# Patient Record
Sex: Male | Born: 1990 | Race: White | Hispanic: No | Smoking: Current every day smoker
Health system: Southern US, Community
[De-identification: ages and names within clinical notes are randomized; demographics above are authoritative.]

## PROBLEM LIST (undated history)

## (undated) DIAGNOSIS — F32A Depression, unspecified: Secondary | ICD-10-CM

## (undated) DIAGNOSIS — T7840XA Allergy, unspecified, initial encounter: Secondary | ICD-10-CM

## (undated) HISTORY — DX: Depression, unspecified: F32.A

## (undated) HISTORY — PX: WISDOM TOOTH EXTRACTION: SHX21

## (undated) HISTORY — DX: Allergy, unspecified, initial encounter: T78.40XA

---

## 2014-04-20 ENCOUNTER — Encounter: Payer: Self-pay | Admitting: Podiatry

## 2014-04-20 ENCOUNTER — Ambulatory Visit (INDEPENDENT_AMBULATORY_CARE_PROVIDER_SITE_OTHER): Payer: 59 | Admitting: Podiatry

## 2014-04-20 VITALS — BP 109/73 | HR 74 | Ht 70.0 in | Wt 200.0 lb

## 2014-04-20 DIAGNOSIS — L600 Ingrowing nail: Secondary | ICD-10-CM | POA: Diagnosis not present

## 2014-04-20 NOTE — Progress Notes (Signed)
   Subjective:    Patient ID: Nathan Johnson, male    DOB: 10/30/1990, 24 y.o.   MRN: [id number]  HPI Comments: N ingrown toenails L B/L 1st medial toenail borders D 1 month O C pain and redness A enclosed shoes T hx of B/L 1st lateral toenail borders  Patient has history of matricectomy's to the lateral margin of the hallux nails approximated 12 years ago   Review of Systems  All other systems reviewed and are negative.      Objective:   Physical Exam  Orientated 3  Vascular: DP pulses 2/4 bilaterally PT pulses 2/4 bilaterally Capillary reflex media bilaterally  Neurological: Sensation to 10 g monofilament wire intact 5/5 bilaterally Ankle reflex equal and reactive bilaterally  Dermatological: Texture and turgor within normal limits bilaterally The medial margins of the right and left hallux toenails are incurvated with callused nail grooves and tenderness to palpation The lateral margin of the hallux nails were narrowed previously from surgical treatment  Musculoskeletal: No deformities noted bilaterally There is no restriction ankle, subtalar, midtarsal joints bilaterally       Assessment & Plan:   Assessment: Satisfactory neurovascular status Ingrowing medial margins of the right and left hallux toenails  Plan: Discuss treatment options including no treatment, repetitive debridement, permanent nail removal. Patient verbally consents to permanent nail removal of the medial margins of the hallux toenails  Plan: The right and left hallux were blocked with 3 mL 50-50 mixture of 2% plain Xylocaine and 0.5% plain Marcaine. The toes were pain with Betadine and exsanguinated. The medial margin of the right and left hallux toenail were excised and a phenol matricectomy performed. An antibiotic compression dressing was applied. The tourniquets were released and spontaneous capillary filling times noted to the right and left hallux. Patient tolerated procedures  without any difficulty  Postoperative oral reconstruction provided  Reappoint at patient's request

## 2014-04-20 NOTE — Patient Instructions (Signed)
Okay to use over-the-counter Tylenol, ibuprofen, naproxen, as needed for pain control Leave initial bandages on today and remove tomorrow your convenience  ANTIBACTERIAL SOAP INSTRUCTIONS  THE DAY AFTER PROCEDURE  Please follow the instructions your doctor has marked.   Shower as usual. Before getting out, place a drop of antibacterial liquid soap (Dial) on a wet, clean washcloth.  Gently wipe washcloth over affected area.  Afterward, rinse the area with warm water.  Blot the area dry with a soft cloth and cover with antibiotic ointment (neosporin, polysporin, bacitracin) and band aid or gauze and tape  Place 3-4 drops of antibacterial liquid soap in a quart of warm tap water.  Submerge foot into water for 20 minutes.  If bandage was applied after your procedure, leave on to allow for easy lift off, then remove and continue with soak for the remaining time.  Next, blot area dry with a soft cloth and cover with a bandage.  Apply other medications as directed by your doctor, such as cortisporin otic solution (eardrops) or neosporin antibiotic ointment

## 2014-05-22 ENCOUNTER — Other Ambulatory Visit: Payer: Self-pay

## 2014-05-22 ENCOUNTER — Emergency Department (HOSPITAL_BASED_OUTPATIENT_CLINIC_OR_DEPARTMENT_OTHER)
Admission: EM | Admit: 2014-05-22 | Discharge: 2014-05-22 | Disposition: A | Discharge status: Home / Self Care | Payer: Managed Care, Other (non HMO) | Attending: Emergency Medicine | Admitting: Emergency Medicine

## 2014-05-22 ENCOUNTER — Encounter (HOSPITAL_BASED_OUTPATIENT_CLINIC_OR_DEPARTMENT_OTHER): Payer: Self-pay | Admitting: *Deleted

## 2014-05-22 DIAGNOSIS — R0602 Shortness of breath: Secondary | ICD-10-CM | POA: Diagnosis not present

## 2014-05-22 DIAGNOSIS — F419 Anxiety disorder, unspecified: Secondary | ICD-10-CM | POA: Diagnosis present

## 2014-05-22 DIAGNOSIS — Z79899 Other long term (current) drug therapy: Secondary | ICD-10-CM | POA: Diagnosis not present

## 2014-05-22 DIAGNOSIS — R202 Paresthesia of skin: Secondary | ICD-10-CM | POA: Diagnosis not present

## 2014-05-22 DIAGNOSIS — Z87891 Personal history of nicotine dependence: Secondary | ICD-10-CM | POA: Insufficient documentation

## 2014-05-22 DIAGNOSIS — R000 Tachycardia, unspecified: Secondary | ICD-10-CM | POA: Diagnosis not present

## 2014-05-22 DIAGNOSIS — F410 Panic disorder [episodic paroxysmal anxiety] without agoraphobia: Secondary | ICD-10-CM | POA: Diagnosis not present

## 2014-05-22 DIAGNOSIS — G479 Sleep disorder, unspecified: Secondary | ICD-10-CM | POA: Diagnosis not present

## 2014-05-22 MED ORDER — LORAZEPAM 1 MG PO TABS
ORAL_TABLET | ORAL | Status: AC
  Filled 2014-05-22: qty 2

## 2014-05-22 MED ORDER — ALPRAZOLAM 0.5 MG PO TABS: 0.5000 mg | ORAL_TABLET | Freq: Three times a day (TID) | ORAL | Status: DC | PRN

## 2014-05-22 MED ORDER — LORAZEPAM 1 MG PO TABS: 2.0000 mg | ORAL_TABLET | Freq: Once | ORAL | Status: DC

## 2014-05-22 MED ORDER — LORAZEPAM 1 MG PO TABS
2.0000 mg | ORAL_TABLET | Freq: Once | ORAL | Status: AC
  Administered 2014-05-22: 2 mg via ORAL

## 2014-05-22 NOTE — ED Notes (Signed)
Pt very anxious. Pt hyperventilating and c/o tightness in his hands and arms. Pt sts sudden onset while working apprx. 45 min PTA. Pt denies prior anxiety attacks but admits to being under a lot of stress.

## 2014-05-22 NOTE — Discharge Instructions (Signed)
Take xanax as directed for severe panic only. No driving or operating heavy machinery while taking xanax as it may cause drowsiness. Follow-up with your primary care physician on Monday as scheduled to discuss this panic attack.  Panic Attacks Panic attacks are sudden, short-livedsurges of severe anxiety, fear, or discomfort. They may occur for no reason when you are relaxed, when you are anxious, or when you are sleeping. Panic attacks may occur for a number of reasons:   Healthy people occasionally have panic attacks in extreme, life-threatening situations, such as war or natural disasters. Normal anxiety is a protective mechanism of the body that helps [place] react to danger (fight or flight response).  Panic attacks are often seen with anxiety disorders, such as panic disorder, social anxiety disorder, generalized anxiety disorder, and phobias. Anxiety disorders cause excessive or uncontrollable anxiety. They may interfere with your relationships or other life activities.  Panic attacks are sometimes seen with other mental illnesses, such as depression and posttraumatic stress disorder.  Certain medical conditions, prescription medicines, and drugs of abuse can cause panic attacks. SYMPTOMS  Panic attacks start suddenly, peak within 20 minutes, and are accompanied by four or more of the following symptoms:  Pounding heart or fast heart rate (palpitations).  Sweating.  Trembling or shaking.  Shortness of breath or feeling smothered.  Feeling choked.  Chest pain or discomfort.  Nausea or strange feeling in your stomach.  Dizziness, light-headedness, or feeling like you will faint.  Chills or hot flushes.  Numbness or tingling in your lips or hands and feet.  Feeling that things are not real or feeling that you are not yourself.  Fear of losing control or going crazy.  Fear of dying. Some of these symptoms can mimic serious medical conditions. For example, you may think you are  having a heart attack. Although panic attacks can be very scary, they are not life threatening. DIAGNOSIS  Panic attacks are diagnosed through an assessment by your health care provider. Your health care provider will ask questions about your symptoms, such as where and when they occurred. Your health care provider will also ask about your medical history and use of alcohol and drugs, including prescription medicines. Your health care provider may order blood tests or other studies to rule out a serious medical condition. Your health care provider may refer you to a mental health professional for further evaluation. TREATMENT   Most healthy people who have one or two panic attacks in an extreme, life-threatening situation will not require treatment.  The treatment for panic attacks associated with anxiety disorders or other mental illness typically involves counseling with a mental health professional, medicine, or a combination of both. Your health care provider will help determine what treatment is best for you.  Panic attacks due to physical illness usually go away with treatment of the illness. If prescription medicine is causing panic attacks, talk with your health care provider about stopping the medicine, decreasing the dose, or substituting another medicine.  Panic attacks due to alcohol or drug abuse go away with abstinence. Some adults need professional help in order to stop drinking or using drugs. HOME CARE INSTRUCTIONS   Take all medicines as directed by your health care provider.   Schedule and attend follow-up visits as directed by your health care provider. It is important to keep all your appointments. SEEK MEDICAL CARE IF:  You are not able to take your medicines as prescribed.  Your symptoms do not improve or get worse.  SEEK IMMEDIATE MEDICAL CARE IF:   You experience panic attack symptoms that are different than your usual symptoms.  You have serious thoughts about  hurting yourself or others.  You are taking medicine for panic attacks and have a serious side effect. MAKE SURE YOU:  Understand these instructions.  Will watch your condition.  Will get help right away if you are not doing well or get worse. Document Released: 01/30/2005 Document Revised: 02/04/2013 Document Reviewed: 09/13/2012 [facility] Patient Information 2015 [place], [place]. This information is not intended to replace advice given to you by your health care provider. Make sure you discuss any questions you have with your health care provider.

## 2014-05-22 NOTE — ED Provider Notes (Signed)
CSN: [id number]     Arrival date & time 05/22/14  1530 History   First MD Initiated Contact with Patient 05/22/14 1539     Chief Complaint  Patient presents with  . Anxiety     (Consider location/radiation/quality/duration/timing/severity/associated sxs/prior Treatment) HPI Comments: 24 y/o M c/o increased anxiety x 45 minutes. States he was at work when he started to feel increased stress, fell to his knees, experienced chest tightness, shortness of breath, racing heart, peri-oral tingling, bilateral hand stiffness, and tingling in his arms. Symptoms have slightly eased off since they began, however when his mind starts racing they return. Admits to being under increased stress at this time in his marriage and at work. States he "messed up" and recently told his wife which is increasing his stress. Denies hx of panic attacks, however reports has been diagnosed with bipolar in the past, was on medication, however did not like the way it felt and he came off. Denies SI/HI. Former smoker.  Patient is a 24 y.o. male presenting with anxiety. The history is provided by the patient.  Anxiety    History reviewed. No pertinent past medical history. Past Surgical History  Procedure Laterality Date  . Wisdom tooth extraction     No family history on file. History  Substance Use Topics  . Smoking status: Former Games developermoker  . Smokeless tobacco: Not on file  . Alcohol Use: 0.0 oz/week    0 Standard drinks or equivalent per week    Review of Systems  Respiratory: Positive for chest tightness and shortness of breath.   Neurological:       +Tingling.  Psychiatric/Behavioral: Positive for sleep disturbance and decreased concentration. The patient is nervous/anxious.   All other systems reviewed and are negative.     Allergies  Sulfa antibiotics  Home Medications   Prior to Admission medications   Medication Sig Start Date End Date Taking? Authorizing Provider  Multiple Vitamin  (MULTIVITAMIN) tablet Take 1 tablet by mouth daily.   Yes Historical Provider, MD  ALPRAZolam Prudy Feeler(XANAX) 0.5 MG tablet Take 1 tablet (0.5 mg total) by mouth every 8 (eight) hours as needed for anxiety. 05/22/14   Kathrynn Speedobyn M Sarath Privott, PA-C  valACYclovir (VALTREX) 500 MG tablet Take 500 mg by mouth 2 (two) times daily.    Historical Provider, MD   BP 150/82 mmHg  Pulse 92  Temp(Src) 98 F (36.7 C) (Oral)  Resp 20  Ht 5\' 10"  (1.778 m)  Wt 195 lb (88.451 kg)  BMI 27.98 kg/m2  SpO2 100% Physical Exam  Constitutional: He is oriented to person, place, and time. He appears well-developed and well-nourished. No distress.  HENT:  Head: Normocephalic and atraumatic.  Mouth/Throat: Oropharynx is clear and moist.  Eyes: Conjunctivae and EOM are normal. Pupils are equal, round, and reactive to light.  Neck: Normal range of motion. Neck supple. No JVD present.  Cardiovascular: Regular rhythm, normal heart sounds and intact distal pulses.   Tachycardic.  Pulmonary/Chest: Effort normal and breath sounds normal. No respiratory distress.  Abdominal: Soft. Bowel sounds are normal. There is no tenderness.  Musculoskeletal: Normal range of motion. He exhibits no edema.  Neurological: He is alert and oriented to person, place, and time. He has normal strength. No sensory deficit.  Speech fluent, goal oriented. Moves limbs without ataxia. Equal grip strength bilateral.  Skin: Skin is warm and dry. He is not diaphoretic.  Psychiatric: His behavior is normal. His mood appears anxious. He expresses no homicidal and no suicidal ideation.  Tearful.  Nursing note and vitals reviewed.   ED Course  Procedures (including critical care time) Labs Review Labs Reviewed - No data to display  Imaging Review No results found.   EKG Interpretation   Date/Time:  Friday May 22 2014 15:37:54 EDT Ventricular Rate:  118 PR Interval:  134 QRS Duration: 102 QT Interval:  324 QTC Calculation: 454 R Axis:   87 Text  Interpretation:  Sinus tachycardia Otherwise normal ECG Confirmed by  HARRISON  MD, FORREST ([id number]) on 05/22/2014 3:40:51 PM      MDM   Final diagnoses:  Panic attack   Anxious, tearful but in no apparent distress. Afebrile, tachycardic, vitals otherwise stable. EKG showing sinus tachycardia, otherwise normal. Doubt cardiac or PE in nature. Symptoms completely resolved after receiving Ativan. Heart rate decreased to the 80s and low 90s. Patient has scheduled an appointment with his PCP in 3 days. I advised him at that time to discuss this panic attack, and possibly starting anti-anxiety medications on a regular basis for long-term. In the meantime, will give an Rx for ten 0.5 mg Xanax for emergency panic attack only. Stable for discharge. Return precautions given. Patient states understanding of treatment care plan and is agreeable.  Kathrynn Speed, PA-C 05/22/14 1648  Purvis Sheffield, MD 05/22/14 [phone]

## 2017-05-25 ENCOUNTER — Ambulatory Visit (INDEPENDENT_AMBULATORY_CARE_PROVIDER_SITE_OTHER): Payer: Managed Care, Other (non HMO) | Admitting: Podiatry

## 2017-05-25 ENCOUNTER — Encounter: Payer: Self-pay | Admitting: Podiatry

## 2017-05-25 ENCOUNTER — Ambulatory Visit (INDEPENDENT_AMBULATORY_CARE_PROVIDER_SITE_OTHER): Payer: Managed Care, Other (non HMO)

## 2017-05-25 VITALS — BP 118/62 | HR 64 | Resp 16

## 2017-05-25 DIAGNOSIS — S90219A Contusion of unspecified great toe with damage to nail, initial encounter: Secondary | ICD-10-CM

## 2017-05-25 NOTE — Patient Instructions (Signed)

## 2017-05-27 NOTE — Progress Notes (Signed)
Subjective:   Patient ID: Nathan Johnson, male   DOB: 27 y.o.   MRN: [id number]   HPI Patient presents stating he damaged his left hallux nail several months ago and is been getting looser and it at times is bothersome and is very discolored.  Patient does not smoke likes to be active   Review of Systems  All other systems reviewed and are negative.       Objective:  Physical Exam  Constitutional: He appears well-developed and well-nourished.  Cardiovascular: Intact distal pulses.  Pulmonary/Chest: Effort normal.  Musculoskeletal: Normal range of motion.  Neurological: He is alert.  Skin: Skin is warm.  Nursing note and vitals reviewed.   Neurovascular status intact muscle strength is adequate range of motion within normal limits with patient found to have discolored left hallux nail that is dystrophic moderately loose and painful when palpated.  Patient has good digital perfusion well oriented x3     Assessment:  Damaged left hallux nail left with significant discoloration of the bed     Plan:  H&P condition reviewed and at this point recommended nail removal and allow any new nail to regrow explain it may not regrow normally ultimately require permanent procedure.  Patient wants this to be done and today I infiltrated 60 mill grams like Marcaine mixture sterile prep applied to the toe using sterile his mentation I removed the nail all proud flesh I flushed the nail plate and applied sterile dressing.  Patient will be seen back to recheck instructions on soaks

## 2017-12-19 ENCOUNTER — Other Ambulatory Visit: Payer: Self-pay | Admitting: Family Medicine

## 2017-12-19 DIAGNOSIS — R748 Abnormal levels of other serum enzymes: Secondary | ICD-10-CM

## 2017-12-19 LAB — HM HEPATITIS C SCREENING LAB: HM Hepatitis Screen: NEGATIVE

## 2017-12-19 LAB — HEPATITIS B SURFACE ANTIGEN: Hepatitis B Surface Ag: NEGATIVE

## 2017-12-24 ENCOUNTER — Ambulatory Visit
Admission: RE | Admit: 2017-12-24 | Discharge: 2017-12-24 | Disposition: A | Discharge status: Home / Self Care | Payer: Managed Care, Other (non HMO) | Source: Ambulatory Visit | Attending: Family Medicine | Admitting: Family Medicine

## 2017-12-24 DIAGNOSIS — R748 Abnormal levels of other serum enzymes: Secondary | ICD-10-CM

## 2018-12-07 ENCOUNTER — Other Ambulatory Visit: Payer: Self-pay

## 2018-12-07 DIAGNOSIS — Z20822 Contact with and (suspected) exposure to covid-19: Secondary | ICD-10-CM

## 2018-12-08 LAB — NOVEL CORONAVIRUS, NAA: SARS-CoV-2, NAA: NOT DETECTED

## 2020-03-18 DIAGNOSIS — F319 Bipolar disorder, unspecified: Secondary | ICD-10-CM | POA: Diagnosis not present

## 2020-05-07 IMAGING — US US ABDOMEN COMPLETE
1 series · 13 of 25 positions shown · non-contrast
Comparison: None.

CLINICAL DATA: The elevated liver enzymes

EXAM:
ABDOMEN ULTRASOUND COMPLETE

[Series 1: us abdomen complete · 0.30mm/px · 13 of 94 slices shown]
[im 1/94]
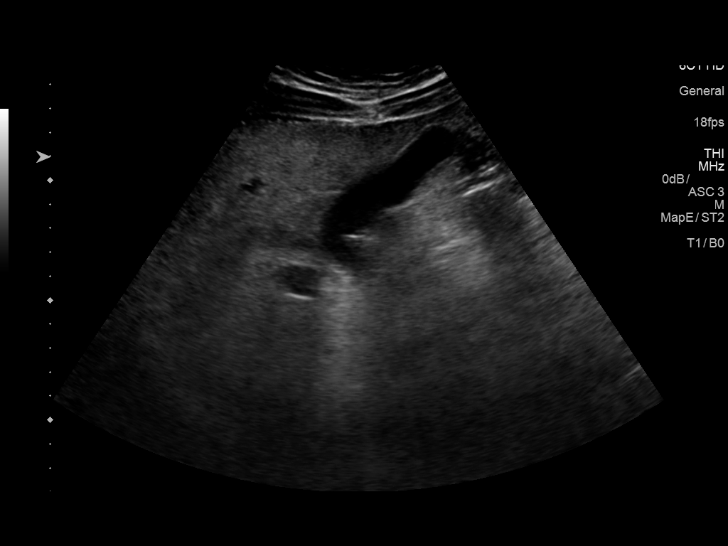
[im 8/94]
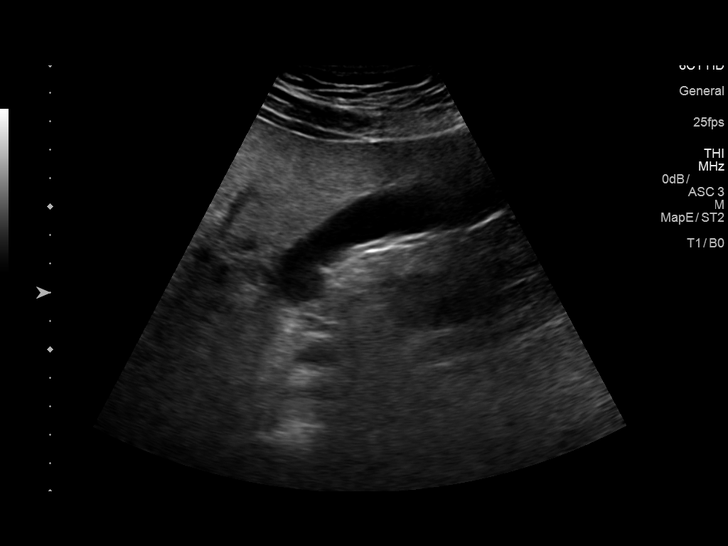
[im 16/94]
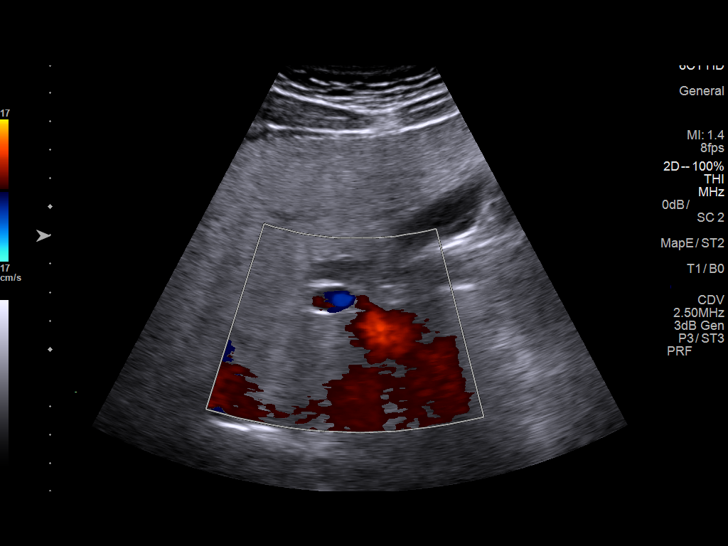
[im 24/94]
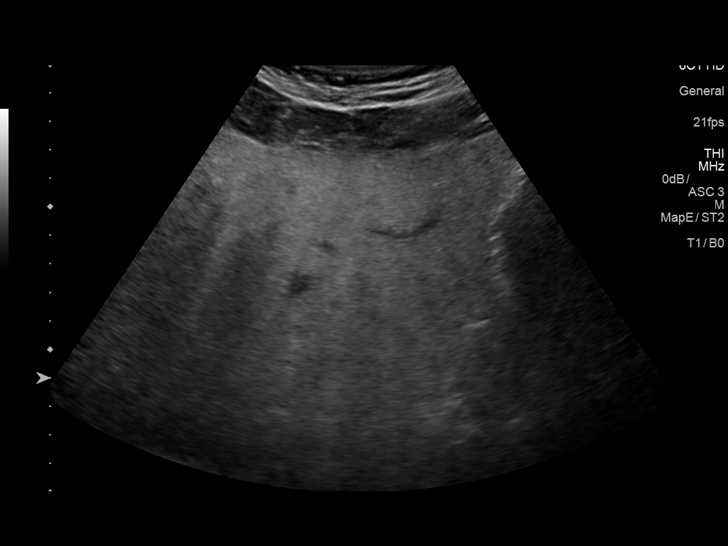
[im 32/94]
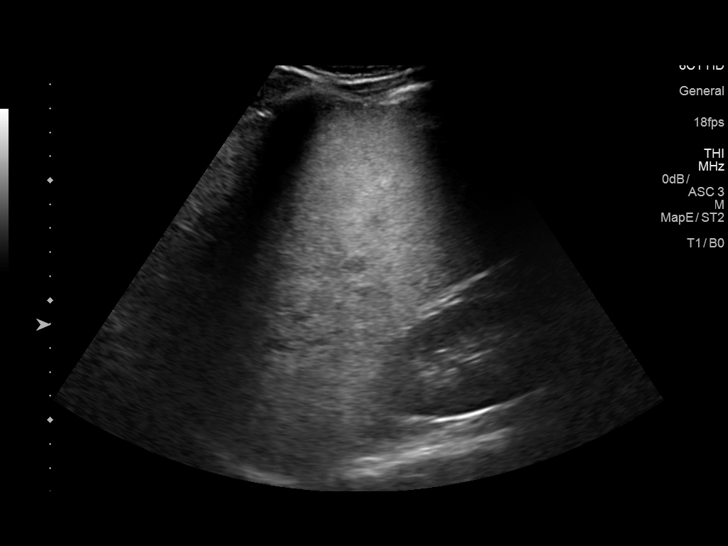
[im 39/94]
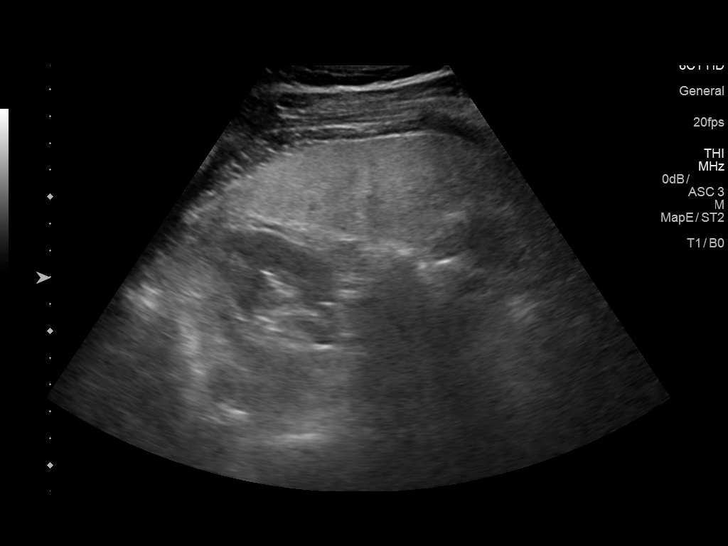
[im 47/94]
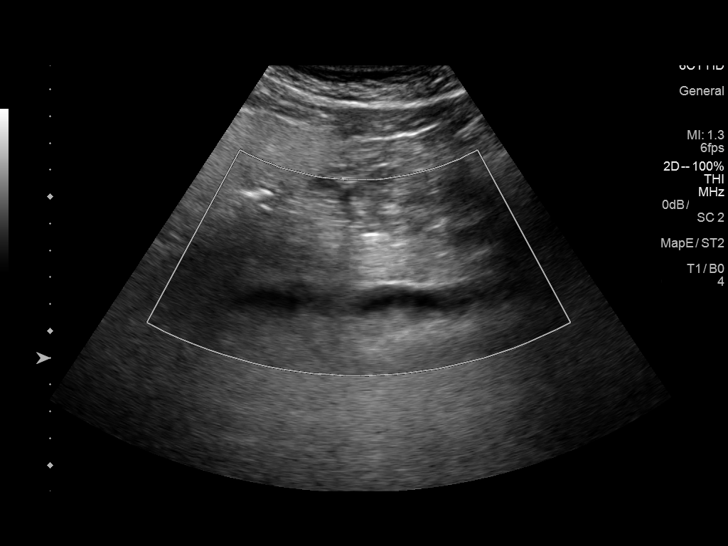
[im 55/94]
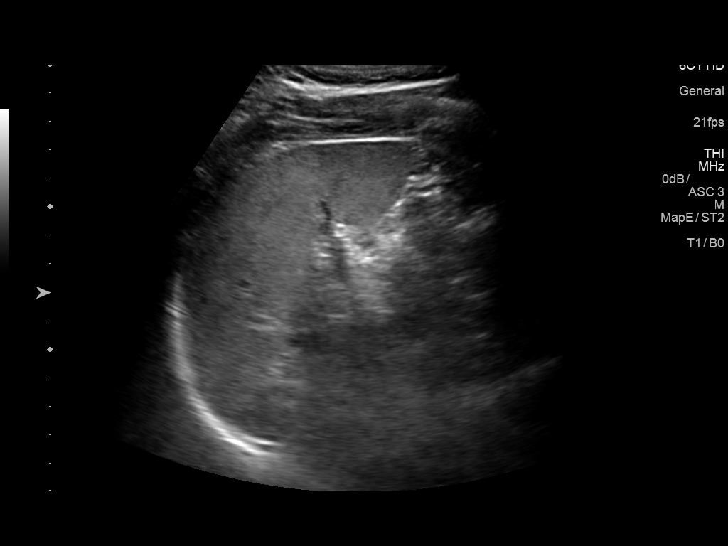
[im 63/94]
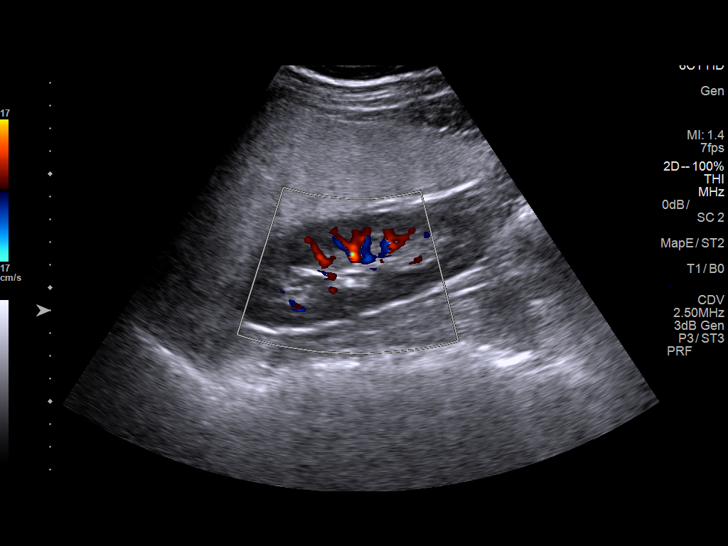
[im 70/94]
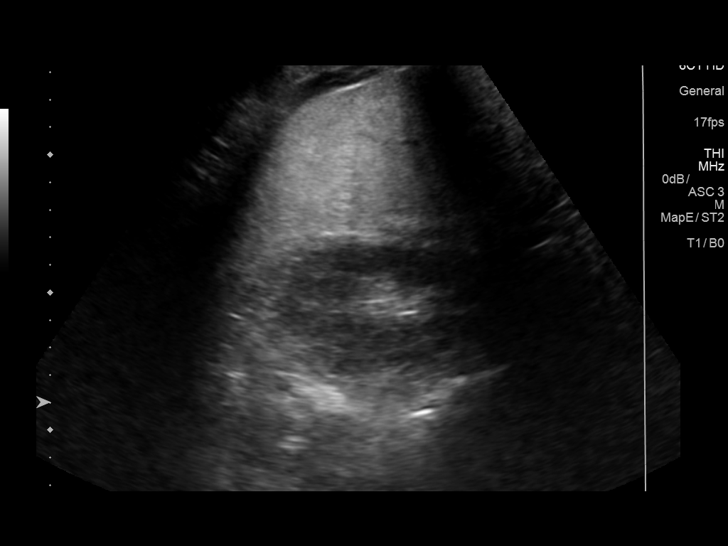
[im 78/94]
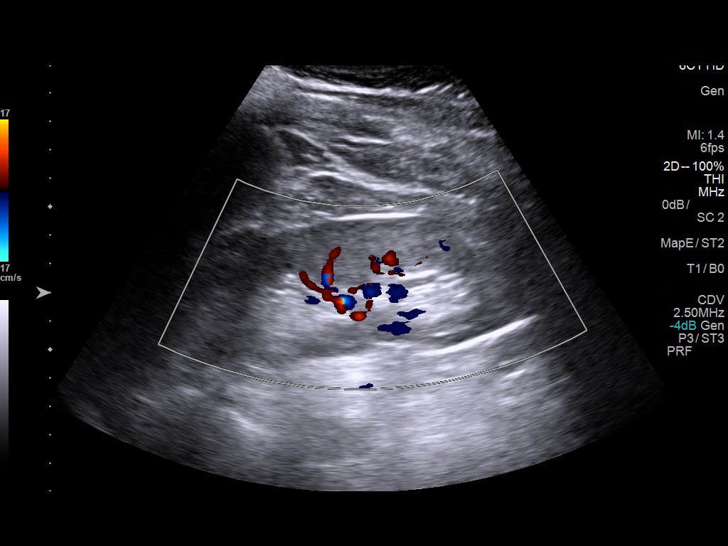
[im 86/94]
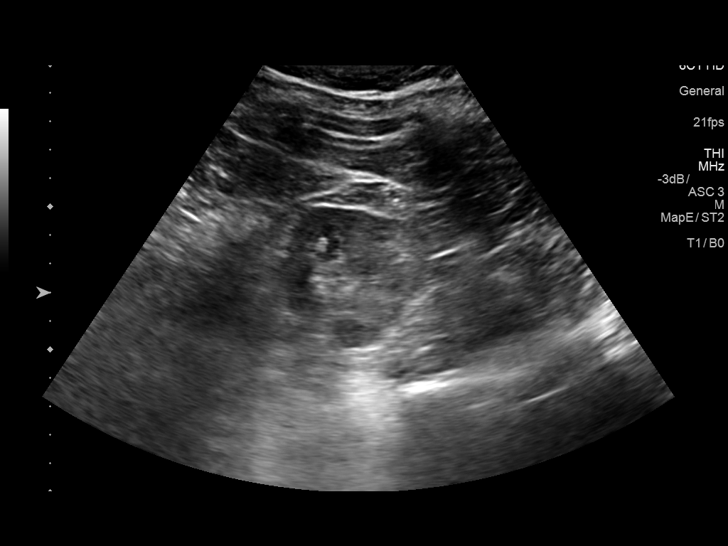
[im 94/94]
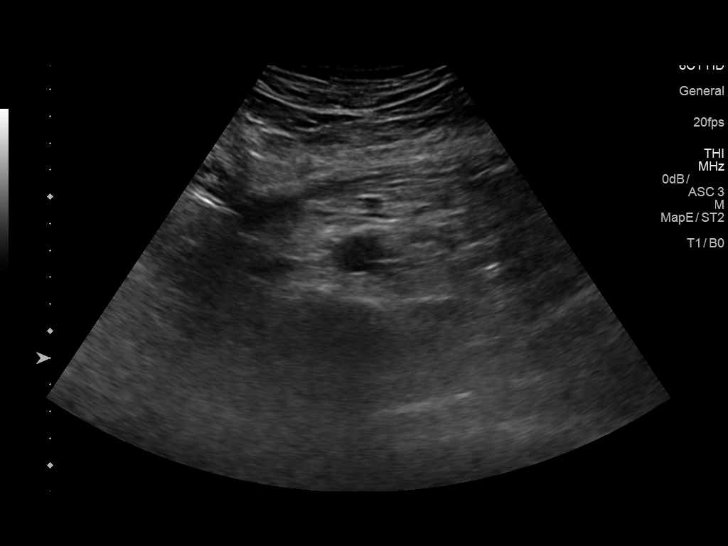

[13 of 25 positions shown; findings below may reference images not displayed]

FINDINGS: Gallbladder: No gallstones or wall thickening visualized. No
sonographic Murphy sign noted by sonographer.

Common bile duct: Diameter: 5 mm

Liver: The hepatic echotexture is increased diffusely. There is
subtle decreased echogenicity near the porta hepatis. There was no
solid-appearing mass or intrahepatic ductal dilation. Portal vein is
patent on color Doppler imaging with normal direction of blood flow
towards the liver.

IVC: No abnormality visualized.

Pancreas: Visualized portion unremarkable.

Spleen: Size and appearance within normal limits.

Right Kidney: Length: 11.1 cm. Echogenicity within normal limits. No
mass or hydronephrosis visualized.

Left Kidney: Length: 12.4 cm. Echogenicity within normal limits. No
mass or hydronephrosis visualized.

Abdominal aorta: No aneurysm visualized.

Other findings: There is no ascites.
IMPRESSION: Mildly increased hepatic echotexture most compatible with fatty
infiltrative change. No surface contour abnormality or suspicious
masses. No splenomegaly or ascites.

Normal gallbladder. If there are clinical concerns of chronic
gallbladder dysfunction, a nuclear medicine hepatobiliary scan with
gallbladder ejection fraction determination may be useful.

No acute intra-abdominal abnormality.

## 2020-06-10 DIAGNOSIS — F319 Bipolar disorder, unspecified: Secondary | ICD-10-CM | POA: Diagnosis not present

## 2020-06-24 DIAGNOSIS — F319 Bipolar disorder, unspecified: Secondary | ICD-10-CM | POA: Diagnosis not present

## 2020-07-07 DIAGNOSIS — F319 Bipolar disorder, unspecified: Secondary | ICD-10-CM | POA: Diagnosis not present

## 2020-07-14 DIAGNOSIS — F319 Bipolar disorder, unspecified: Secondary | ICD-10-CM | POA: Diagnosis not present

## 2020-07-21 DIAGNOSIS — F319 Bipolar disorder, unspecified: Secondary | ICD-10-CM | POA: Diagnosis not present

## 2020-08-19 DIAGNOSIS — F319 Bipolar disorder, unspecified: Secondary | ICD-10-CM | POA: Diagnosis not present

## 2020-10-21 DIAGNOSIS — L232 Allergic contact dermatitis due to cosmetics: Secondary | ICD-10-CM | POA: Diagnosis not present

## 2020-10-29 DIAGNOSIS — F319 Bipolar disorder, unspecified: Secondary | ICD-10-CM | POA: Diagnosis not present

## 2021-01-20 DIAGNOSIS — F319 Bipolar disorder, unspecified: Secondary | ICD-10-CM | POA: Diagnosis not present

## 2021-06-10 DIAGNOSIS — H1033 Unspecified acute conjunctivitis, bilateral: Secondary | ICD-10-CM | POA: Diagnosis not present

## 2021-09-21 DIAGNOSIS — A6000 Herpesviral infection of urogenital system, unspecified: Secondary | ICD-10-CM | POA: Diagnosis not present

## 2021-09-21 DIAGNOSIS — R739 Hyperglycemia, unspecified: Secondary | ICD-10-CM | POA: Diagnosis not present

## 2021-09-21 DIAGNOSIS — Z13220 Encounter for screening for lipoid disorders: Secondary | ICD-10-CM | POA: Diagnosis not present

## 2021-09-21 DIAGNOSIS — Z0000 Encounter for general adult medical examination without abnormal findings: Secondary | ICD-10-CM | POA: Diagnosis not present

## 2021-09-21 LAB — LIPID PANEL
Cholesterol: 118 (ref 0–200)
HDL: 48 (ref 35–70)
LDL Cholesterol: 56
Triglycerides: 62 (ref 40–160)

## 2021-09-21 LAB — HEMOGLOBIN A1C: Hemoglobin A1C: 5.8

## 2022-01-31 ENCOUNTER — Ambulatory Visit: Payer: BC Managed Care – PPO | Admitting: Physician Assistant

## 2022-03-07 ENCOUNTER — Encounter: Payer: Self-pay | Admitting: Physician Assistant

## 2022-03-07 ENCOUNTER — Ambulatory Visit (INDEPENDENT_AMBULATORY_CARE_PROVIDER_SITE_OTHER): Payer: BC Managed Care – PPO | Admitting: Physician Assistant

## 2022-03-07 VITALS — BP 110/70 | HR 96 | Temp 97.8°F | Ht 70.0 in | Wt 190.0 lb

## 2022-03-07 DIAGNOSIS — E88819 Insulin resistance, unspecified: Secondary | ICD-10-CM | POA: Insufficient documentation

## 2022-03-07 DIAGNOSIS — B009 Herpesviral infection, unspecified: Secondary | ICD-10-CM

## 2022-03-07 DIAGNOSIS — Z720 Tobacco use: Secondary | ICD-10-CM | POA: Insufficient documentation

## 2022-03-07 DIAGNOSIS — F3176 Bipolar disorder, in full remission, most recent episode depressed: Secondary | ICD-10-CM | POA: Diagnosis not present

## 2022-03-07 MED ORDER — VALACYCLOVIR HCL 500 MG PO TABS: 500.0000 mg | ORAL_TABLET | Freq: Every day | ORAL | 3 refills | Status: DC

## 2022-03-07 NOTE — Patient Instructions (Addendum)
It was great to see you!  Valtrex sent for one year  Follow-up after August 9th this fall for a physical -- sooner if concerns!  Take care,  Inda Coke PA-C

## 2022-03-07 NOTE — Progress Notes (Signed)
Nathan Johnson is a 32 y.o. male here for a follow up of a pre-existing problem.  History of Present Illness:   Chief Complaint  Patient presents with   Establish Care    HPI  Tobacco Abuse Has recently started smoking cigarettes again. Smokes approx 3 cigarettes daily. Also uses nicotine pouches to help reduce cigarette use.   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 Medication caused him to sleep and eat and he got up to 215 lb Weaned himself off of vraylar last year and feels like his mood is stable and well controlled Mom has bipolar disorder, uncontrolled   Insulin Resistance Highest A1c was 5.8%. He has never been on medication    Past Medical History:  Diagnosis Date   Allergy    Depression      Social History   Tobacco Use   Smoking status: Every Day    Packs/day: 0.25    Years: 2.00    Total pack years: 0.50    Types: Cigarettes   Smokeless tobacco: Never   Tobacco comments:    Using Nicotine pouches 3 mg twice a day.  Vaping Use   Vaping Use: Former  Substance Use Topics   Alcohol use: Yes    Alcohol/week: 3.0 standard drinks of alcohol    Types: 3 Cans of beer per week   Drug use: No    Past Surgical History:  Procedure Laterality Date   WISDOM TOOTH EXTRACTION      Family History  Problem Relation Age of Onset   Depression Mother    Cancer Maternal Grandfather    Diabetes Maternal Grandfather    Stroke Paternal Grandfather    Diabetes Paternal Grandmother    Asthma Son     Allergies  Allergen Reactions   Sulfa Antibiotics Rash    Current Medications:   Current Outpatient Medications:    valACYclovir (VALTREX) 500 MG tablet, Take 500 mg by mouth daily. Take twice a day for breakouts, Disp: , Rfl:    Review of Systems:    ROS Negative unless otherwise specified per HPI.  Vitals:   Vitals:   03/07/22 1417  BP: 110/70  Pulse: 96  Temp: 97.8 F (36.6 C)  TempSrc: Temporal  SpO2: 98%  Weight: 190 lb (86.2 kg)  Height: 5\' 10"  (1.778 m)     Body mass index is 27.26 kg/m.  Physical Exam:   Physical Exam Vitals and nursing note reviewed.  Constitutional:      General: He is not in acute distress.    Appearance: He is well-developed. He is not ill-appearing or toxic-appearing.  Cardiovascular:     Rate and Rhythm: Normal rate and regular rhythm.     Pulses: Normal pulses.     Heart sounds: Normal heart sounds, S1 normal and S2 normal.  Pulmonary:     Effort: Pulmonary effort is normal.     Breath sounds: Normal breath sounds.  Skin:    General: Skin is warm and dry.  Neurological:     Mental Status: He is alert.     GCS: GCS eye subscore is 4. GCS verbal subscore is 5. GCS motor subscore is 6.  Psychiatric:        Speech: Speech normal.        Behavior: Behavior normal.  Behavior is cooperative.     Assessment and Plan:   Tobacco abuse Overall stable  HSV infection Well controlled Continue Valtrex 500 mg daily Follow-up when due for physical  Insulin resistance Update at CPE Continue healthy lifestyle efforts  Bipolar disorder, in full remission, most recent episode depressed ([place]) Well controlled without medication Denies SI/HI Follow-up as needed I discussed with patient that if they develop any SI, to tell someone immediately and seek medical attention.   Inda Coke, PA-C

## 2022-03-14 ENCOUNTER — Encounter: Payer: Self-pay | Admitting: Physician Assistant

## 2022-04-25 ENCOUNTER — Encounter (HOSPITAL_BASED_OUTPATIENT_CLINIC_OR_DEPARTMENT_OTHER): Payer: Self-pay | Admitting: Emergency Medicine

## 2022-04-25 ENCOUNTER — Telehealth: Payer: Self-pay | Admitting: Physician Assistant

## 2022-04-25 ENCOUNTER — Emergency Department (HOSPITAL_COMMUNITY): Payer: BC Managed Care – PPO

## 2022-04-25 ENCOUNTER — Emergency Department (HOSPITAL_BASED_OUTPATIENT_CLINIC_OR_DEPARTMENT_OTHER)
Admission: EM | Admit: 2022-04-25 | Discharge: 2022-04-25 | Disposition: A | Discharge status: Home / Self Care | Payer: BC Managed Care – PPO | Attending: Emergency Medicine | Admitting: Emergency Medicine

## 2022-04-25 ENCOUNTER — Emergency Department (HOSPITAL_BASED_OUTPATIENT_CLINIC_OR_DEPARTMENT_OTHER): Payer: BC Managed Care – PPO

## 2022-04-25 DIAGNOSIS — Z1152 Encounter for screening for COVID-19: Secondary | ICD-10-CM | POA: Insufficient documentation

## 2022-04-25 DIAGNOSIS — R200 Anesthesia of skin: Secondary | ICD-10-CM | POA: Diagnosis not present

## 2022-04-25 DIAGNOSIS — R9431 Abnormal electrocardiogram [ECG] [EKG]: Secondary | ICD-10-CM | POA: Diagnosis not present

## 2022-04-25 DIAGNOSIS — R202 Paresthesia of skin: Secondary | ICD-10-CM | POA: Diagnosis not present

## 2022-04-25 DIAGNOSIS — R29818 Other symptoms and signs involving the nervous system: Secondary | ICD-10-CM | POA: Diagnosis not present

## 2022-04-25 LAB — DIFFERENTIAL
Abs Immature Granulocytes: 0.03 10*3/uL (ref 0.00–0.07)
Basophils Absolute: 0 10*3/uL (ref 0.0–0.1)
Basophils Relative: 0 %
Eosinophils Absolute: 0.1 10*3/uL (ref 0.0–0.5)
Eosinophils Relative: 1 %
Immature Granulocytes: 0 %
Lymphocytes Relative: 35 %
Lymphs Abs: 3 10*3/uL (ref 0.7–4.0)
Monocytes Absolute: 0.5 10*3/uL (ref 0.1–1.0)
Monocytes Relative: 6 %
Neutro Abs: 4.9 10*3/uL (ref 1.7–7.7)
Neutrophils Relative %: 58 %

## 2022-04-25 LAB — URINALYSIS, ROUTINE W REFLEX MICROSCOPIC
Bilirubin Urine: NEGATIVE
Glucose, UA: NEGATIVE mg/dL
Hgb urine dipstick: NEGATIVE
Ketones, ur: NEGATIVE mg/dL
Leukocytes,Ua: NEGATIVE
Nitrite: NEGATIVE
Protein, ur: NEGATIVE mg/dL
Specific Gravity, Urine: 1.015 (ref 1.005–1.030)
pH: 7.5 (ref 5.0–8.0)

## 2022-04-25 LAB — CBC
HCT: 46.7 % (ref 39.0–52.0)
Hemoglobin: 16.3 g/dL (ref 13.0–17.0)
MCH: 32 pg (ref 26.0–34.0)
MCHC: 34.9 g/dL (ref 30.0–36.0)
MCV: 91.7 fL (ref 80.0–100.0)
Platelets: 240 10*3/uL (ref 150–400)
RBC: 5.09 MIL/uL (ref 4.22–5.81)
RDW: 11.9 % (ref 11.5–15.5)
WBC: 8.5 10*3/uL (ref 4.0–10.5)
nRBC: 0 % (ref 0.0–0.2)

## 2022-04-25 LAB — COMPREHENSIVE METABOLIC PANEL
ALT: 38 U/L (ref 0–44)
AST: 31 U/L (ref 15–41)
Albumin: 4.9 g/dL (ref 3.5–5.0)
Alkaline Phosphatase: 51 U/L (ref 38–126)
Anion gap: 8 (ref 5–15)
BUN: 18 mg/dL (ref 6–20)
CO2: 25 mmol/L (ref 22–32)
Calcium: 9.5 mg/dL (ref 8.9–10.3)
Chloride: 104 mmol/L (ref 98–111)
Creatinine, Ser: 1.16 mg/dL (ref 0.61–1.24)
GFR, Estimated: >60 mL/min (ref >60)
Glucose, Bld: 104 mg/dL — ABNORMAL HIGH (ref 70–99)
Potassium: 3.6 mmol/L (ref 3.5–5.1)
Sodium: 137 mmol/L (ref 135–145)
Total Bilirubin: 0.7 mg/dL (ref 0.3–1.2)
Total Protein: 8.1 g/dL (ref 6.5–8.1)

## 2022-04-25 LAB — RESP PANEL BY RT-PCR (RSV, FLU A&B, COVID)  RVPGX2
Influenza A by PCR: NEGATIVE
Influenza B by PCR: NEGATIVE
Resp Syncytial Virus by PCR: NEGATIVE
SARS Coronavirus 2 by RT PCR: NEGATIVE

## 2022-04-25 LAB — RAPID URINE DRUG SCREEN, HOSP PERFORMED
Amphetamines: NOT DETECTED
Barbiturates: NOT DETECTED
Benzodiazepines: NOT DETECTED
Cocaine: NOT DETECTED
Opiates: NOT DETECTED
Tetrahydrocannabinol: NOT DETECTED

## 2022-04-25 LAB — ETHANOL: Alcohol, Ethyl (B): <10 mg/dL (ref <10)

## 2022-04-25 LAB — APTT: aPTT: 28 seconds (ref 24–36)

## 2022-04-25 LAB — PROTIME-INR
INR: 1 (ref 0.8–1.2)
Prothrombin Time: 13.2 seconds (ref 11.4–15.2)

## 2022-04-25 MED ORDER — IOHEXOL 350 MG/ML SOLN
100.0000 mL | Freq: Once | INTRAVENOUS | Status: AC | PRN
  Administered 2022-04-25: 75 mL via INTRAVENOUS

## 2022-04-25 MED ORDER — LORAZEPAM 1 MG PO TABS: 0.5000 mg | ORAL_TABLET | ORAL | Status: DC | PRN

## 2022-04-25 NOTE — ED Triage Notes (Addendum)
Pt arrives pov, steady gait, c/o LT arm tingling with decreased sensation x 5 hours and RT leg "tingling" upon arrival.  Pt also reports HA. Speech clear, AOx 4. VAN neg. Spoke with ED [facility].

## 2022-04-25 NOTE — ED Notes (Signed)
Called [facility] for Transport to [facility] ED for MRI Talked to Maudie Mercury at [facility]

## 2022-04-25 NOTE — ED Provider Notes (Signed)
He has no significant past medical history.  He said he woke up with his left arm being numb and painful around 5 AM this morning.  He also had an episode of some tingling in his right leg and left face.  Had minimal headache later on occipital.  Most of the symptoms resolved although still has a little bit of numbness in his left hand rating up his left arm.  No weakness.  He was seen at [facility] and had a negative CT angio.  Neurology was consulted Dr. Quinn Axe and recommendation was transferred to get an MRI.  He denies any worsening of his complaints.  No recent illness.  Physical Exam  BP 109/71 (BP Location: Left Arm)   Pulse 68   Temp 98.3 F (36.8 C) (Oral)   Resp 16   Wt 83 kg   SpO2 95%   BMI 26.26 kg/m   Physical Exam  Procedures  Procedures  ED Course / MDM    Medical Decision Making Amount and/or Complexity of Data Reviewed Labs: ordered. Radiology: ordered.  Risk Prescription drug management.   MRI does not show any acute findings.  Reviewed with patient.  Have placed a referral into neurology as outpatient.  Return instructions discussed.       Hayden Rasmussen, MD 04/25/22 2007

## 2022-04-25 NOTE — Telephone Encounter (Signed)
Final Disposition: Call EMS Now  Patient Name: Nathan Johnson Gender: Male DOB: 12-08-1990 Age: 32 Y 10 M 7 D Return Phone Number: [phone] (Primary), [phone] (Secondary) Address: City/ State/ Zip: [place]  [address] Client [facility] Client Site [facility] [facility] Provider Morene Rankins, [place]- PA Contact Type Call Who Is Calling Patient / Member / Family / Caregiver Call Type Triage / Clinical Relationship To Patient Self Return Phone Number [phone] (Primary) Chief Complaint NUMBNESS/TINGLING- sudden on one side of the body or face Reason for Call Symptomatic / Request for [place] says he is having left arm tingling. BP 152/100 pain in his neck Translation No Nurse Assessment Nurse: D'Heur Lucia Gaskins, RN, Adrienne Date/Time (Eastern Time): 04/25/2022 10:34:55 AM Confirm and document reason for call. If symptomatic, describe symptoms. ---Caller says he is having left arm tingling that he woke up with. BP is 152/100; he was also shaking. He is also having pain in left side of his neck and the back of his head. Does the patient have any new or worsening symptoms? ---Yes Will a triage be completed? ---Yes Related visit to physician within the last 2 weeks? ---No Does the PT have any chronic conditions? (i.e. diabetes, asthma, this includes High risk factors for pregnancy, etc.) ---Yes List chronic conditions. ---Cameron Sprang Is this a behavioral health or substance abuse call? ---No Guidelines Guideline Title Affirmed Question Affirmed Notes Nurse Date/Time (Eastern Time) Neurologic Deficit [1] Numbness (i.e., loss of sensation) of the face, arm / hand, or leg / foot on one side of the body AND [place], RN, Vincente Liberty 04/25/2022 10:37:28 AM PLEASE NOTE: All timestamps contained within this report are represented as [place] Standard  Time. CONFIDENTIALTY NOTICE: This fax transmission is intended only for the addressee. It contains information that is legally privileged, confidential or otherwise protected from use or disclosure. If you are not the intended recipient, you are strictly prohibited from reviewing, disclosing, copying using or disseminating any of this information or taking any action in reliance on or regarding this information. If you have received this fax in error, please notify us immediately by telephone so that we can arrange for its return to [place]. Phone: [phone], Toll-Free: [phone], Fax: [phone] Page: 2 of 2 Call Id: [id number] Guidelines Guideline Title Affirmed Question Affirmed Notes Nurse Date/Time Eilene Ghazi Time) [2] sudden onset AND [3] present now Disp. Time Eilene Ghazi Time) Disposition Final User 04/25/2022 10:33:32 AM Send to Urgent Evette Georges 04/25/2022 10:40:09 AM Call EMS 911 Now Yes D'Heur Lucia Gaskins, RN, Vincente Liberty 04/25/2022 10:41:24 AM 911 Outcome Documentation [place], RN, Vincente Liberty Reason: Friend driving to ED Final Disposition 04/25/2022 10:40:09 AM Call EMS 911 Now Yes D'Heur Lucia Gaskins, RN, Vincente Liberty Caller Disagree/Comply Comply Caller Understands Yes PreDisposition Call Doctor Care Advice Given Per Guideline CALL EMS 911 NOW: CARE ADVICE given per Neurologic Deficit (Adult) guideline. Referrals [facility] - ED

## 2022-04-25 NOTE — ED Provider Notes (Addendum)
[facility] [place] Provider Note   CSN: [id number] Arrival date & time: 04/25/22  1058     History  Chief Complaint  Patient presents with   Headache    Nathan Johnson is a 32 y.o. male.   Headache Associated symptoms: numbness      32 year old male presenting to the emergency department with concern for left arm and facial numbness.  The patient states that he was last normal at 1030 last night when he went to bed.  He woke up this morning with numbness and heaviness in the left arm.  He did not feel weakness in the arm.  He thought that maybe he had slept on it funny.  He has since also noted numbness along the left side of his face.  He endorses paresthesias through the left arm.  Denies any other extremity numbness or weakness.  He denies any facial droop.  No dysarthria or dysphagia.  No fever, chills, cough, chest pain or shortness of breath. .  He did develop a brief posterior headache located along the left side of his neck.  He states he has chronic neck pain history and occasionally gets radiculopathy to his left shoulder.  Home Medications Prior to Admission medications   Medication Sig Start Date End Date Taking? Authorizing Provider  valACYclovir (VALTREX) 500 MG tablet Take 1 tablet (500 mg total) by mouth daily. Take 500 mg daily 03/07/22   Inda Coke, PA      Allergies    Sulfa antibiotics    Review of Systems   Review of Systems  Neurological:  Positive for numbness and headaches.  All other systems reviewed and are negative.   Physical Exam Updated Vital Signs BP 109/68   Pulse 80   Temp 99.1 F (37.3 C)   Resp 17   Wt 83 kg   SpO2 99%   BMI 26.26 kg/m  Physical Exam Vitals and nursing note reviewed.  Constitutional:      General: He is not in acute distress.    Appearance: He is well-developed.  HENT:     Head: Normocephalic and atraumatic.  Eyes:     Conjunctiva/sclera: Conjunctivae normal.   Cardiovascular:     Rate and Rhythm: Normal rate and regular rhythm.     Heart sounds: No murmur heard. Pulmonary:     Effort: Pulmonary effort is normal. No respiratory distress.     Breath sounds: Normal breath sounds.  Abdominal:     Palpations: Abdomen is soft.     Tenderness: There is no abdominal tenderness.  Musculoskeletal:        General: No swelling.     Cervical back: Neck supple.  Skin:    General: Skin is warm and dry.     Capillary Refill: Capillary refill takes less than 2 seconds.  Neurological:     Mental Status: He is alert.     Comments: MENTAL STATUS EXAM:    Orientation: Alert and oriented to person, place and time.  Memory: Cooperative, follows commands well.  Language: Speech is clear and language is normal.   CRANIAL NERVES:    CN 2 (Optic): Visual fields intact to confrontation.  CN 3,4,6 (EOM): Pupils equal and reactive to light. Full extraocular eye movement without nystagmus.  CN 5 (Trigeminal): Facial sensation is normal, no weakness of masticatory muscles.  CN 7 (Facial): No facial weakness or asymmetry.  CN 8 (Auditory): Auditory acuity grossly normal.  CN 9,10 (Glossophar): The  uvula is midline, the palate elevates symmetrically.  CN 11 (spinal access): Normal sternocleidomastoid and trapezius strength.  CN 12 (Hypoglossal): The tongue is midline. No atrophy or fasciculations.Marland Kitchen   MOTOR:  Muscle Strength: 5/5RUE, 5/5LUE, 5/5RLE, 5/5LLE.   COORDINATION:   Intact finger-to-nose, no tremor.   SENSATION:   Intact to light touch all four extremities with diminished sensation to light touch in the left upper extremity.     Psychiatric:        Mood and Affect: Mood normal.     ED Results / Procedures / Treatments   Labs (all labs ordered are listed, but only abnormal results are displayed) Labs Reviewed  COMPREHENSIVE METABOLIC PANEL - Abnormal; Notable for the following components:      Result Value   Glucose, Bld 104 (*)    All other  components within normal limits  RESP PANEL BY RT-PCR (RSV, FLU A&B, COVID)  RVPGX2  ETHANOL  PROTIME-INR  APTT  CBC  DIFFERENTIAL  RAPID URINE DRUG SCREEN, HOSP PERFORMED  URINALYSIS, ROUTINE W REFLEX MICROSCOPIC    EKG EKG Interpretation  Date/Time:  Tuesday April 25 2022 12:22:50 EDT Ventricular Rate:  67 PR Interval:  165 QRS Duration: 111 QT Interval:  395 QTC Calculation: 417 R Axis:   71 Text Interpretation: Sinus rhythm No significant change since last tracing Confirmed by Regan Lemming ([id number]) on 04/25/2022 1:21:55 PM  Radiology CT ANGIO HEAD NECK W WO CM  Result Date: 04/25/2022 CLINICAL DATA:  Neuro deficit, acute, stroke suspected EXAM: CT ANGIOGRAPHY HEAD AND NECK TECHNIQUE: Multidetector CT imaging of the head and neck was performed using the standard protocol during bolus administration of intravenous contrast. Multiplanar CT image reconstructions and MIPs were obtained to evaluate the vascular anatomy. Carotid stenosis measurements (when applicable) are obtained utilizing NASCET criteria, using the distal internal carotid diameter as the denominator. RADIATION DOSE REDUCTION: This exam was performed according to the departmental dose-optimization program which includes automated exposure control, adjustment of the mA and/or kV according to patient size and/or use of iterative reconstruction technique. CONTRAST:  76m OMNIPAQUE IOHEXOL 350 MG/ML SOLN COMPARISON:  None Available. FINDINGS: CT HEAD FINDINGS Brain: No evidence of acute infarction, hemorrhage, hydrocephalus, extra-axial collection or mass lesion/mass effect. Vascular: Detailed below. Skull: No acute fracture. Sinuses/Orbits: Clear sinuses.  No acute orbital findings. Other: No mastoid effusions. Review of the MIP images confirms the above findings CTA NECK FINDINGS Aortic arch: Great vessel origins are patent without significant stenosis. Right carotid system: No evidence of dissection, stenosis (50% or greater),  or occlusion. Left carotid system: No evidence of dissection, stenosis (50% or greater), or occlusion. Vertebral arteries: Right dominant. No evidence of dissection, stenosis (50% or greater), or occlusion. Skeleton: No acute findings. Other neck: No acute findings. Upper chest: Clear lung apices. Review of the MIP images confirms the above findings CTA HEAD FINDINGS Anterior circulation: Bilateral intracranial ICAs, MCAs and ACAs are patent without proximal hemodynamically significant stenosis. Posterior circulation: Bilateral intradural vertebral arteries, basilar artery and bilateral posterior cerebral arteries are patent without proximal hemodynamically significant stenosis. Venous sinuses: As permitted by contrast timing, patent. Review of the MIP images confirms the above findings IMPRESSION: 1. No evidence of acute intracranial abnormality. 2. No large vessel occlusion or proximal hemodynamically significant stenosis. Electronically Signed   By: FMargaretha SheffieldM.D.   On: 04/25/2022 13:46    Procedures Procedures    Medications Ordered in ED Medications  iohexol (OMNIPAQUE) 350 MG/ML injection 100 mL (75 mLs Intravenous Contrast  Given 04/25/22 1313)    ED Course/ Medical Decision Making/ A&P                             Medical Decision Making Amount and/or Complexity of Data Reviewed Labs: ordered. Radiology: ordered.  Risk Prescription drug management.     32 year old male presenting to the emergency department with concern for left arm and facial numbness.  The patient states that he was last normal at 1030 last night when he went to bed.  He woke up this morning with numbness and heaviness in the left arm.  He did not feel weakness in the arm.  He thought that maybe he had slept on it funny.  He has since also noted numbness along the left side of his face.  He endorses paresthesias through the left arm.  Denies any other extremity numbness or weakness.  He denies any facial  droop.  No dysarthria or dysphagia.  No fever, chills, cough, chest pain or shortness of breath. .  He did develop a brief posterior headache located along the left side of his neck.  He states he has chronic neck pain history and occasionally gets radiculopathy to his left shoulder.  On arrival, the patient was vitally stable, afebrile, not tachycardic or tachypneic, saturating 100% on room air, hemodynamically stable.  Physical exam significant for diminished sensation in the left upper extremity.  Patient states he has subsequently developed sensory deficit in the left face as well but did not notice it as much as the left arm earlier.  The patient is outside the window for tPA/TNK.  Lab Evaluation significant for CMP with a CBG of 104, otherwise unremarkable, CBC with differential unremarkable, ethanol level negative, UDS, urinalysis unremarkable, COVID-19, influenza, RSV PCR testing negative.   CTA head and neck with and without: IMPRESSION:  1. No evidence of acute intracranial abnormality.  2. No large vessel occlusion or proximal hemodynamically significant  stenosis.   I discussed the care of the patient with on-call neurology, Dr. Quinn Axe who recommended ER to ER transfer to [facility] for MRI imaging of the brain to evaluate for possible stroke.  Additionally have considered complex migraine however the patient currently denies any symptoms of headache.  Dr. Alvino Chapel at the [facility], ER was contacted and accepted the patient in ER to ER transfer.   Final Clinical Impression(s) / ED Diagnoses Final diagnoses:  Numbness    Rx / DC Orders ED Discharge Orders     None         Regan Lemming, MD 04/25/22 1544    Regan Lemming, MD 04/25/22 1544

## 2022-04-25 NOTE — ED Notes (Signed)
Called x 2 attempts to notify charge RN about pt coming for MRI

## 2022-04-25 NOTE — ED Notes (Signed)
Pt presents from [facility] for MRI.  Pt reports numbness and tinging in L arm.

## 2022-04-25 NOTE — Telephone Encounter (Signed)
Patient states: - Has been experiencing left arm tingling since 5:30am - BP was 152/100, highest he has seen; Associated sx is pain in back of head/neck   Patient has been transferred to triage.

## 2022-04-25 NOTE — Telephone Encounter (Signed)
Pt is in the ED. No triage note available yet.

## 2022-04-25 NOTE — Discharge Instructions (Signed)
You were seen in the emergency department for some numbness and pain in your left arm and also left face and right leg.  You had a CAT scan, lab work and an MRI of your brain that did not show an obvious explanation for your symptoms.  Please follow-up with your primary care doctor we have also put a referral in for you to see neurology.  Return to the emergency department if any worsening or concerning symptoms

## 2022-04-26 NOTE — Telephone Encounter (Signed)
FYI, Triage note. Pt went to ED 

## 2022-09-13 ENCOUNTER — Encounter (INDEPENDENT_AMBULATORY_CARE_PROVIDER_SITE_OTHER): Payer: Self-pay

## 2022-09-26 ENCOUNTER — Ambulatory Visit (INDEPENDENT_AMBULATORY_CARE_PROVIDER_SITE_OTHER): Payer: BC Managed Care – PPO | Admitting: Physician Assistant

## 2022-09-26 ENCOUNTER — Encounter: Payer: Self-pay | Admitting: Physician Assistant

## 2022-09-26 VITALS — BP 110/70 | HR 68 | Temp 97.8°F | Ht 70.0 in | Wt 188.5 lb

## 2022-09-26 DIAGNOSIS — B009 Herpesviral infection, unspecified: Secondary | ICD-10-CM | POA: Diagnosis not present

## 2022-09-26 DIAGNOSIS — E88819 Insulin resistance, unspecified: Secondary | ICD-10-CM

## 2022-09-26 DIAGNOSIS — Z0000 Encounter for general adult medical examination without abnormal findings: Secondary | ICD-10-CM | POA: Diagnosis not present

## 2022-09-26 DIAGNOSIS — Z114 Encounter for screening for human immunodeficiency virus [HIV]: Secondary | ICD-10-CM | POA: Diagnosis not present

## 2022-09-26 DIAGNOSIS — Z720 Tobacco use: Secondary | ICD-10-CM

## 2022-09-26 MED ORDER — VALACYCLOVIR HCL 500 MG PO TABS: 500.0000 mg | ORAL_TABLET | Freq: Every day | ORAL | 3 refills | Status: DC

## 2022-09-26 NOTE — Patient Instructions (Signed)
It was great to see you!  Stop smoking!!  Please go to the lab for blood work.   Our office will call you with your results unless you have chosen to receive results via MyChart.  If your blood work is normal we will follow-up each year for physicals and as scheduled for chronic medical problems.  If anything is abnormal we will treat accordingly and get you in for a follow-up.  Take care,  Lelon Mast

## 2022-09-26 NOTE — Progress Notes (Signed)
Subjective:    Nathan Johnson is a 32 y.o. male and is here for a comprehensive physical exam.  HPI  Health Maintenance Due  Topic Date Due   HIV Screening  Never done    Acute Concerns: None.  Chronic Issues: Tobacco abuse -- smokes about 4-5 cigarettes a day. 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 Tolerating well.  Insulin resistance -- needs A1c updated. Has cleaned up his diet.  Health Maintenance: Immunizations -- UTD Colonoscopy -- N/A PSA -- No results found for: "PSA1", "PSA" Diet -- Healthy overall.  Sleep habits -- Good sleep quality. Exercise -- Walks a lot.   Weight: Recent weight history Wt Readings from Last 10 Encounters:  09/26/22 188 lb 8 oz (85.5 kg)  04/25/22 183 lb (83 kg)  03/07/22 190 lb (86.2 kg)  05/22/14 195 lb (88.5 kg)  04/20/14 200 lb (90.7 kg)   Body mass index is 27.05 kg/m.  Mood -- Stable.  Alcohol use --  reports current alcohol use of about 3.0 standard drinks of alcohol per week.  Tobacco use --  Tobacco Use: High Risk (09/26/2022)   Patient History    Smoking Tobacco Use: Every Day    Smokeless Tobacco Use: Never    Passive Exposure: Not on file    Eligible for Low Dose CT? no  UTD with eye doctor? No.  UTD with dentist? Yes     09/26/2022    2:57 PM  Depression screen PHQ 2/9  Decreased Interest 0  Down, Depressed, Hopeless 0  PHQ - 2 Score 0    Other providers/specialists: Patient Care Team: Jarold Motto, [place] as PCP - General (Physician Assistant)    PMHx, SurgHx, SocialHx, Medications, and Allergies were reviewed in the Visit Navigator and updated as appropriate.   Past Medical History:  Diagnosis Date   Allergy    Depression      Past Surgical History:  Procedure Laterality Date   WISDOM TOOTH EXTRACTION       Family History  Problem Relation Age of Onset   Depression Mother    ADD / ADHD Mother    Prostate cancer Maternal Grandfather        mets to  bone   Diabetes Maternal Grandfather    Cancer Maternal Grandfather    Diabetes Paternal Grandmother    Cancer Paternal Grandmother    Stroke Paternal Grandfather    Asthma Son    ADD / ADHD Sister     Social History   Tobacco Use   Smoking status: Every Day    Current packs/day: 0.25    Average packs/day: 0.3 packs/day for 2.0 years (0.5 ttl pk-yrs)    Types: Cigarettes   Smokeless tobacco: Never   Tobacco comments:    Using Nicotine pouches 3 mg twice a day.  Vaping Use   Vaping status: Former  Substance Use Topics   Alcohol use: Yes    Alcohol/week: 3.0 standard drinks of alcohol    Types: 3 Cans of beer per week    Comment: social drinker   Drug use: No    Review of Systems:   Review of Systems  Constitutional:  Negative for chills, fever, malaise/fatigue and weight loss.  HENT:  Negative for hearing loss, sinus pain and sore throat.   Respiratory:  Negative for cough and hemoptysis.   Cardiovascular:  Negative for chest pain, palpitations, leg swelling and PND.  Gastrointestinal:  Negative for abdominal pain,  constipation, diarrhea, heartburn, nausea and vomiting.  Genitourinary:  Negative for dysuria, frequency and urgency.  Musculoskeletal:  Negative for back pain, myalgias and neck pain.  Skin:  Negative for itching and rash.  Neurological:  Negative for dizziness, tingling, seizures and headaches.  Endo/Heme/Allergies:  Negative for polydipsia.  Psychiatric/Behavioral:  Negative for depression. The patient is not nervous/anxious.     Objective:    Vitals:   09/26/22 1458  BP: 110/70  Pulse: 68  Temp: 97.8 F (36.6 C)  SpO2: 98%    Body mass index is 27.05 kg/m.  General  Alert, cooperative, no distress, appears stated age  Head:  Normocephalic, without obvious abnormality, atraumatic  Eyes:  PERRL, conjunctiva/corneas clear, EOM's intact, fundi benign, both eyes       Ears:  Normal TM's and external ear canals, both ears  Nose: Nares normal,  septum midline, mucosa normal, no drainage or sinus tenderness  Throat: Lips, mucosa, and tongue normal; teeth and gums normal  Neck: Supple, symmetrical, trachea midline, no adenopathy;     thyroid:  No enlargement/tenderness/nodules; no carotid bruit or JVD  Back:   Symmetric, no curvature, ROM normal, no CVA tenderness  Lungs:   Clear to auscultation bilaterally, respirations unlabored  Chest wall:  No tenderness or deformity  Heart:  Regular rate and rhythm, S1 and S2 normal, no murmur, rub or gallop  Abdomen:   Soft, non-tender, bowel sounds active all four quadrants, no masses, no organomegaly  Extremities: Extremities normal, atraumatic, no cyanosis or edema  Prostate : Deferred   Skin: Skin color, texture, turgor normal, no rashes or lesions  Lymph nodes: Cervical, supraclavicular, and axillary nodes normal  Neurologic: CNII-XII grossly intact. Normal strength, sensation and reflexes throughout   AssessmentPlan:   Routine physical examination Today patient counseled on age appropriate routine health concerns for screening and prevention, each reviewed and up to date or declined. Immunizations reviewed and up to date or declined. Labs ordered and reviewed. Risk factors for depression reviewed and negative. Hearing function and visual acuity are intact. ADLs screened and addressed as needed. Functional ability and level of safety reviewed and appropriate. Education, counseling and referrals performed based on assessed risks today. Patient provided with a copy of personalized plan for preventive services.  Tobacco abuse Not ready to quit  HSV infection Well controlled Continue valtrex 500 mg daily  Insulin resistance Update A1c and provide recommendations accordingly  Screening for HIV (human immunodeficiency virus) Update HIV    I,Emily Lagle,acting as a scribe for [employer], PA.,have documented all relevant documentation on the behalf of Jarold Motto, PA,as directed  by  Jarold Motto, PA while in the presence of Jarold Motto, [place].  I, Jarold Motto, [place], have reviewed all documentation for this visit. The documentation on 09/26/22 for the exam, diagnosis, procedures, and orders are all accurate and complete.  Jarold Motto, PA-C [facility]

## 2022-10-08 DIAGNOSIS — Z23 Encounter for immunization: Secondary | ICD-10-CM | POA: Diagnosis not present

## 2022-10-08 DIAGNOSIS — M79641 Pain in right hand: Secondary | ICD-10-CM | POA: Diagnosis not present

## 2022-10-08 DIAGNOSIS — S50811A Abrasion of right forearm, initial encounter: Secondary | ICD-10-CM | POA: Diagnosis not present

## 2022-10-08 DIAGNOSIS — S59911A Unspecified injury of right forearm, initial encounter: Secondary | ICD-10-CM | POA: Diagnosis not present

## 2023-04-30 ENCOUNTER — Ambulatory Visit (INDEPENDENT_AMBULATORY_CARE_PROVIDER_SITE_OTHER): Admitting: Physician Assistant

## 2023-04-30 ENCOUNTER — Encounter: Payer: Self-pay | Admitting: Physician Assistant

## 2023-04-30 VITALS — BP 120/70 | HR 83 | Temp 97.9°F | Ht 70.0 in | Wt 192.2 lb

## 2023-04-30 DIAGNOSIS — H029 Unspecified disorder of eyelid: Secondary | ICD-10-CM | POA: Diagnosis not present

## 2023-04-30 MED ORDER — HYDROCORTISONE 0.5 % EX CREA: TOPICAL_CREAM | CUTANEOUS | 0 refills | Status: AC

## 2023-04-30 NOTE — Patient Instructions (Signed)
 It was great to see you!  Start steroid cream -- apply to affected 1-2 x daily x 1 week  Keep area moisturized with topical moisturizer such as cerave or cetaphil, ideally with SPF daily  If no change, please let me know and I will send in alternative treatment(s)   Take care,  Jarold Motto PA-C

## 2023-04-30 NOTE — Progress Notes (Signed)
 Nathan Johnson is a 33 y.o. male here for a new problem.  History of Present Illness:   Chief Complaint  Patient presents with   Rash    Pt c/o rash on right eyelid, itching x 2 weeks. Has been taking Claritin, but still there.    HPI  Rash on eye lid: Pt complains of a rash on her right eye lid starting 2 weeks ago.  Endorse associated itchiness.  He has been taking Claritin 24 hour; last dose was yesterday  His rash has improved from last week. Only washes his face with dove.  Uses a moisturizer on his face, which he has not used since onset of sx. Has not used any new hygiene products on his face or eye.  Denies any flaking, crusting, or swelling.   Past Medical History:  Diagnosis Date   Allergy    Depression      Social History   Tobacco Use   Smoking status: Every Day    Current packs/day: 0.25    Average packs/day: 0.3 packs/day for 2.0 years (0.5 ttl pk-yrs)    Types: Cigarettes   Smokeless tobacco: Never   Tobacco comments:    Using Nicotine pouches 3 mg twice a day.  Vaping Use   Vaping status: Former  Substance Use Topics   Alcohol use: Yes    Alcohol/week: 3.0 standard drinks of alcohol    Types: 3 Cans of beer per week    Comment: social drinker   Drug use: No    Past Surgical History:  Procedure Laterality Date   WISDOM TOOTH EXTRACTION      Family History  Problem Relation Age of Onset   Depression Mother    ADD / ADHD Mother    Prostate cancer Maternal Grandfather        mets to bone   Diabetes Maternal Grandfather    Cancer Maternal Grandfather    Diabetes Paternal Grandmother    Cancer Paternal Grandmother    Stroke Paternal Grandfather    Asthma Son    ADD / ADHD Sister     Allergies  Allergen Reactions   Sulfa Antibiotics Rash    Current Medications:   Current Outpatient Medications:    hydrocortisone cream 0.5 %, Apply to affected area 1-2 times daily, Disp: 30 g, Rfl: 0   valACYclovir (VALTREX) 500 MG tablet, Take 1  tablet (500 mg total) by mouth daily. Take 500 mg daily, Disp: 90 tablet, Rfl: 3   Review of Systems:   Negative unless otherwise specified per HPI.  Vitals:   Vitals:   04/30/23 0817  BP: 120/70  Pulse: 83  Temp: 97.9 F (36.6 C)  TempSrc: Temporal  SpO2: 98%  Weight: 192 lb 4 oz (87.2 kg)  Height: 5\' 10"  (1.778 m)     Body mass index is 27.59 kg/m.  Physical Exam:   Physical Exam Vitals and nursing note reviewed.  Constitutional:      Appearance: He is well-developed.  HENT:     Head: Normocephalic.  Eyes:     General:        Right eye: No discharge.        Left eye: No discharge.     Conjunctiva/sclera: Conjunctivae normal.     Pupils: Pupils are equal, round, and reactive to light.     Comments: Right upper lid with erythematous papules  Lateral aspect of right eye with slight excoriated skin  Pulmonary:     Effort: Pulmonary effort is  normal.  Musculoskeletal:        General: Normal range of motion.     Cervical back: Normal range of motion.  Skin:    General: Skin is warm and dry.  Neurological:     Mental Status: He is alert and oriented to person, place, and time.  Psychiatric:        Behavior: Behavior normal.        Thought Content: Thought content normal.        Judgment: Judgment normal.     Assessment and Plan:   1. Eyelid abnormality (Primary) No red flags Suspect possible dermatitis Trial hydrocortisone to area Recommend regular moisturizing with spf If no improvement, consider trialing ketoconazole   I, Isabelle Course, acting as a Neurosurgeon for Jarold Motto, [place]., have documented all relevant documentation on the behalf of Jarold Motto, [place], as directed by  Jarold Motto, PA while in the presence of Jarold Motto, [place].  I, Jarold Motto, [place], have reviewed all documentation for this visit. The documentation on 04/30/23 for the exam, diagnosis, procedures, and orders are all accurate and complete.  Jarold Motto, PA-C

## 2023-06-08 ENCOUNTER — Encounter: Payer: Self-pay | Admitting: Physician Assistant

## 2023-06-08 ENCOUNTER — Ambulatory Visit (INDEPENDENT_AMBULATORY_CARE_PROVIDER_SITE_OTHER): Admitting: Physician Assistant

## 2023-06-08 VITALS — BP 110/70 | HR 62 | Temp 97.3°F | Ht 70.0 in | Wt 191.4 lb

## 2023-06-08 DIAGNOSIS — H029 Unspecified disorder of eyelid: Secondary | ICD-10-CM | POA: Diagnosis not present

## 2023-06-08 MED ORDER — KETOCONAZOLE 2 % EX CREA: TOPICAL_CREAM | CUTANEOUS | 0 refills | Status: AC

## 2023-06-08 NOTE — Progress Notes (Signed)
 Nathan Johnson is a 33 y.o. male here for a follow up of a pre-existing problem.  History of Present Illness:   Chief Complaint  Patient presents with   Eye Problem    Pt c/o right eyelid on the side and top of eyelid is still there, having some itching, has been using cortisone cream.   Rash  Patient continues to complains of a right upper and lower eyelid rash that has persisted for the past month.  Has been applying cortisone cream which seem to relief/ clear it for sometime but symptoms seem to return. Rash is not as itchy as previously.   Reports taking any allergy pil when needed. Typically takes it when noticing difficulty breathing, running nose and eyes. There's accompanying occasional dryness and flaking.  Denies any symptoms in his beard or other regions.   Past Medical History:  Diagnosis Date   Allergy    Depression      Social History   Tobacco Use   Smoking status: Every Day    Current packs/day: 0.25    Average packs/day: 0.3 packs/day for 2.0 years (0.5 ttl pk-yrs)    Types: Cigarettes   Smokeless tobacco: Never   Tobacco comments:    Using Nicotine pouches 3 mg twice a day.  Vaping Use   Vaping status: Former  Substance Use Topics   Alcohol use: Yes    Alcohol/week: 3.0 standard drinks of alcohol    Types: 3 Cans of beer per week    Comment: social drinker   Drug use: No    Past Surgical History:  Procedure Laterality Date   WISDOM TOOTH EXTRACTION      Family History  Problem Relation Age of Onset   Depression Mother    ADD / ADHD Mother    Prostate cancer Maternal Grandfather        mets to bone   Diabetes Maternal Grandfather    Cancer Maternal Grandfather    Diabetes Paternal Grandmother    Cancer Paternal Grandmother    Stroke Paternal Grandfather    Asthma Son    ADD / ADHD Sister     Allergies  Allergen Reactions   Sulfa Antibiotics Rash    Current Medications:   Current Outpatient Medications:    hydrocortisone  cream  0.5 %, Apply to affected area 1-2 times daily, Disp: 30 g, Rfl: 0   ketoconazole  (NIZORAL ) 2 % cream, Apply to affected area one time daily, Disp: 15 g, Rfl: 0   valACYclovir  (VALTREX ) 500 MG tablet, Take 1 tablet (500 mg total) by mouth daily. Take 500 mg daily, Disp: 90 tablet, Rfl: 3   Review of Systems:   Review of Systems  Skin:  Positive for rash.    Vitals:   Vitals:   06/08/23 1119  BP: 110/70  Pulse: 62  Temp: (!) 97.3 F (36.3 C)  TempSrc: Temporal  SpO2: 99%  Weight: 191 lb 6.1 oz (86.8 kg)  Height: 5\' 10"  (1.778 m)     Body mass index is 27.46 kg/m.  Physical Exam:   Physical Exam Vitals and nursing note reviewed.  Constitutional:      Appearance: He is well-developed.  HENT:     Head: Normocephalic.  Eyes:     Conjunctiva/sclera: Conjunctivae normal.     Pupils: Pupils are equal, round, and reactive to light.     Comments: Outer aspect of right eye and R upper eye lid with erythema and scattered papules  Pulmonary:  Effort: Pulmonary effort is normal.  Musculoskeletal:        General: Normal range of motion.     Cervical back: Normal range of motion.  Skin:    General: Skin is warm and dry.  Neurological:     Mental Status: He is alert and oriented to person, place, and time.  Psychiatric:        Behavior: Behavior normal.        Thought Content: Thought content normal.        Judgment: Judgment normal.     Assessment and Plan:   Eyelid abnormality No significant response to treatment Will trial topical ketoconazole or 50/50 mix of ketoconazole + hydrocortisone  Consider adding daily antihistamine  Dermatology referral placed in case does not help   Alexander Iba, PA-C  I,Safa M Kadhim,acting as a scribe for [employer], PA.,have documented all relevant documentation on the behalf of Alexander Iba, PA,as directed by  Alexander Iba, PA while in the presence of Alexander Iba, [place].   I, Alexander Iba, [place], have reviewed all  documentation for this visit. The documentation on 06/08/23 for the exam, diagnosis, procedures, and orders are all accurate and complete.

## 2023-09-27 ENCOUNTER — Ambulatory Visit (INDEPENDENT_AMBULATORY_CARE_PROVIDER_SITE_OTHER): Payer: BC Managed Care – PPO | Admitting: Physician Assistant

## 2023-09-27 ENCOUNTER — Encounter: Payer: Self-pay | Admitting: Physician Assistant

## 2023-09-27 VITALS — BP 102/70 | HR 78 | Temp 97.3°F | Ht 70.0 in | Wt 186.8 lb

## 2023-09-27 DIAGNOSIS — E88819 Insulin resistance, unspecified: Secondary | ICD-10-CM

## 2023-09-27 DIAGNOSIS — Z0000 Encounter for general adult medical examination without abnormal findings: Secondary | ICD-10-CM | POA: Diagnosis not present

## 2023-09-27 DIAGNOSIS — Z720 Tobacco use: Secondary | ICD-10-CM | POA: Diagnosis not present

## 2023-09-27 DIAGNOSIS — Z13220 Encounter for screening for lipoid disorders: Secondary | ICD-10-CM

## 2023-09-27 DIAGNOSIS — B009 Herpesviral infection, unspecified: Secondary | ICD-10-CM | POA: Diagnosis not present

## 2023-09-27 LAB — COMPREHENSIVE METABOLIC PANEL WITH GFR
ALT: 32 U/L (ref 0–53)
AST: 28 U/L (ref 0–37)
Albumin: 4.5 g/dL (ref 3.5–5.2)
Alkaline Phosphatase: 44 U/L (ref 39–117)
BUN: 15 mg/dL (ref 6–23)
CO2: 26 meq/L (ref 19–32)
Calcium: 9.6 mg/dL (ref 8.4–10.5)
Chloride: 107 meq/L (ref 96–112)
Creatinine, Ser: 1.08 mg/dL (ref 0.40–1.50)
GFR: 89.05 mL/min (ref >60.00)
Glucose, Bld: 112 mg/dL — ABNORMAL HIGH (ref 70–99)
Potassium: 4.2 meq/L (ref 3.5–5.1)
Sodium: 142 meq/L (ref 135–145)
Total Bilirubin: 0.3 mg/dL (ref 0.2–1.2)
Total Protein: 7.4 g/dL (ref 6.0–8.3)

## 2023-09-27 LAB — CBC WITH DIFFERENTIAL/PLATELET
Basophils Absolute: 0 K/uL (ref 0.0–0.1)
Basophils Relative: 0.5 % (ref 0.0–3.0)
Eosinophils Absolute: 0.1 K/uL (ref 0.0–0.7)
Eosinophils Relative: 1.5 % (ref 0.0–5.0)
HCT: 46 % (ref 39.0–52.0)
Hemoglobin: 15.4 g/dL (ref 13.0–17.0)
Lymphocytes Relative: 26.6 % (ref 12.0–46.0)
Lymphs Abs: 2.3 K/uL (ref 0.7–4.0)
MCHC: 33.5 g/dL (ref 30.0–36.0)
MCV: 98.3 fl (ref 78.0–100.0)
Monocytes Absolute: 0.6 K/uL (ref 0.1–1.0)
Monocytes Relative: 7.3 % (ref 3.0–12.0)
Neutro Abs: 5.5 K/uL (ref 1.4–7.7)
Neutrophils Relative %: 64.1 % (ref 43.0–77.0)
Platelets: 236 K/uL (ref 150.0–400.0)
RBC: 4.69 Mil/uL (ref 4.22–5.81)
RDW: 12.4 % (ref 11.5–15.5)
WBC: 8.5 K/uL (ref 4.0–10.5)

## 2023-09-27 LAB — LIPID PANEL
Cholesterol: 106 mg/dL (ref 0–200)
HDL: 45.1 mg/dL (ref >39.00)
LDL Cholesterol: 51 mg/dL (ref 0–99)
NonHDL: 60.6
Total CHOL/HDL Ratio: 2
Triglycerides: 47 mg/dL (ref 0.0–149.0)
VLDL: 9.4 mg/dL (ref 0.0–40.0)

## 2023-09-27 LAB — HEMOGLOBIN A1C: Hgb A1c MFr Bld: 5.5 % (ref 4.6–6.5)

## 2023-09-27 MED ORDER — VALACYCLOVIR HCL 500 MG PO TABS: 500.0000 mg | ORAL_TABLET | Freq: Every day | ORAL | 3 refills | Status: AC

## 2023-09-27 NOTE — Progress Notes (Signed)
 Subjective:    Nathan Johnson is a 33 y.o. male and is here for a comprehensive physical exam.  HPI  Health Maintenance Due  Topic Date Due   Pneumococcal Vaccine: 19-49 Years (1 of 2 - PCV) Never done    Discussed the use of AI scribe software for clinical note transcription with the patient, who gave verbal consent to proceed.  History of Present Illness Nathan Johnson is a 33 year old male who presents for a routine follow-up visit.  He recently attempted to quit smoking for two weeks due to throat irritation but resumed after a stressful day. He does not use vaping products. He engages in brisk walking a couple of days a week, covering about two miles when his son is not with him. He primarily eats at home and avoids fast food. He sleeps well and has no mental health concerns.  Family history includes prostate cancer on his mother's side, with his grandfather having metastatic prostate cancer, and his grandmother had lung cancer.  He denies unexplained headaches, tingling, tremors, joint pain, leg swelling, diarrhea, constipation, rectal bleeding, or heartburn. He wakes once at night to urinate but does not find it concerning.    Health Maintenance: Immunizations -- UpToDate; encouraged annual flu vaxx  Colonoscopy -- n/a PSA -- No results found for: PSA1, PSA Diet -- eats at home for the most part Sleep habits -- good Exercise -- walking regularly -- 2 miles  Weight -- Weight: 186 lb 12.8 oz (84.7 kg)  Recent weight history Wt Readings from Last 10 Encounters:  09/27/23 186 lb 12.8 oz (84.7 kg)  06/08/23 191 lb 6.1 oz (86.8 kg)  04/30/23 192 lb 4 oz (87.2 kg)  09/26/22 188 lb 8 oz (85.5 kg)  04/25/22 183 lb (83 kg)  03/07/22 190 lb (86.2 kg)  05/22/14 195 lb (88.5 kg)  04/20/14 200 lb (90.7 kg)   Body mass index is 26.8 kg/m.  Mood -- no major  Alcohol use --  reports current alcohol use of about 3.0 standard drinks of alcohol per week.  Tobacco use --   Tobacco Use: High Risk (09/27/2023)   Patient History    Smoking Tobacco Use: Every Day    Smokeless Tobacco Use: Never    Passive Exposure: Not on file    Eligible for Low Dose CT? no  UTD with eye doctor? yes UTD with dentist? yes     09/27/2023    7:56 AM  Depression screen PHQ 2/9  Decreased Interest 0  Down, Depressed, Hopeless 0  PHQ - 2 Score 0    Other providers/specialists: Patient Care Team: Job Lukes, [place] as PCP - General (Physician Assistant)    PMHx, SurgHx, SocialHx, Medications, and Allergies were reviewed in the Visit Navigator and updated as appropriate.   Past Medical History:  Diagnosis Date   Allergy    Depression      Past Surgical History:  Procedure Laterality Date   WISDOM TOOTH EXTRACTION       Family History  Problem Relation Age of Onset   Depression Mother    ADD / ADHD Mother    ADD / ADHD Sister    Prostate cancer Maternal Grandfather        mets to bone   Diabetes Maternal Grandfather    Diabetes Paternal Grandmother    Lung cancer Paternal Grandmother    Stroke Paternal Grandfather    Asthma Son    Colon cancer Neg Hx  Social History   Tobacco Use   Smoking status: Every Day    Current packs/day: 0.25    Average packs/day: 0.3 packs/day for 2.3 years (0.6 ttl pk-yrs)    Types: Cigarettes   Smokeless tobacco: Never   Tobacco comments:    Using Nicotine pouches 3 mg twice a day.  Vaping Use   Vaping status: Former  Substance Use Topics   Alcohol use: Yes    Alcohol/week: 3.0 standard drinks of alcohol    Types: 3 Cans of beer per week    Comment: social drinker   Drug use: No    Review of Systems:   Review of Systems  Constitutional:  Negative for chills, fever, malaise/fatigue and weight loss.  HENT:  Negative for hearing loss, sinus pain and sore throat.   Respiratory:  Negative for cough and hemoptysis.   Cardiovascular:  Negative for chest pain, palpitations, leg swelling and PND.   Gastrointestinal:  Negative for abdominal pain, constipation, diarrhea, heartburn, nausea and vomiting.  Genitourinary:  Negative for dysuria, frequency and urgency.  Musculoskeletal:  Negative for back pain, myalgias and neck pain.  Skin:  Negative for itching and rash.  Neurological:  Negative for dizziness, tingling, seizures and headaches.  Endo/Heme/Allergies:  Negative for polydipsia.  Psychiatric/Behavioral:  Negative for depression. The patient is not nervous/anxious.     Objective:    Vitals:   09/27/23 0758  BP: 102/70  Pulse: 78  Temp: (!) 97.3 F (36.3 C)  SpO2: 98%    Body mass index is 26.8 kg/m.  General  Alert, cooperative, no distress, appears stated age  Head:  Normocephalic, without obvious abnormality, atraumatic  Eyes:  PERRL, conjunctiva/corneas clear, EOM's intact, fundi benign, both eyes       Ears:  Normal TM's and external ear canals, both ears  Nose: Nares normal, septum midline, mucosa normal, no drainage or sinus tenderness  Throat: Lips, mucosa, and tongue normal; teeth and gums normal  Neck: Supple, symmetrical, trachea midline, no adenopathy;     thyroid:  No enlargement/tenderness/nodules; no carotid bruit or JVD  Back:   Symmetric, no curvature, ROM normal, no CVA tenderness  Lungs:   Clear to auscultation bilaterally, respirations unlabored  Chest wall:  No tenderness or deformity  Heart:  Regular rate and rhythm, S1 and S2 normal, no murmur, rub or gallop  Abdomen:   Soft, non-tender, bowel sounds active all four quadrants, no masses, no organomegaly  Extremities: Extremities normal, atraumatic, no cyanosis or edema  Prostate : Deferred   Skin: Skin color, texture, turgor normal, no rashes or lesions  Lymph nodes: Cervical, supraclavicular, and axillary nodes normal  Neurologic: CNII-XII grossly intact. Normal strength, sensation and reflexes throughout   AssessmentPlan:   Assessment and Plan Assessment & Plan Comprehensive  Physical Exam (CPE) preventive care annual visit Today patient counseled on age appropriate routine health concerns for screening and prevention, each reviewed and up to date or declined. Immunizations reviewed and up to date or declined. Labs ordered and reviewed. Risk factors for depression reviewed and negative. Hearing function and visual acuity are intact. ADLs screened and addressed as needed. Functional ability and level of safety reviewed and appropriate. Education, counseling and referrals performed based on assessed risks today. Patient provided with a copy of personalized plan for preventive services.  Tobacco use Continues smoking despite past cessation attempts. Not interested in vaping. - Encouraged smoking cessation and reduction efforts. - Discussed risks of smoking and benefits of quitting.  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 - Continue valacyclovir  500 mg daily. - Ensure prescription is filled for a year's supply.  Insulin resistance Previous labs showed prediabetic range. - Order blood work to check diabetes levels.      Lucie Buttner, PA-C  [facility]

## 2023-09-28 ENCOUNTER — Ambulatory Visit: Payer: Self-pay | Admitting: Physician Assistant

## 2023-09-28 NOTE — Telephone Encounter (Signed)
 Spoke with patient, patient was not fasting on day of Lab  Glucose elevated due to not fasting  Verbalized understanding

## 2024-01-13 ENCOUNTER — Ambulatory Visit
Admission: EM | Admit: 2024-01-13 | Discharge: 2024-01-13 | Disposition: A | Discharge status: Other Acute Inpatient Hospital

## 2024-01-13 ENCOUNTER — Other Ambulatory Visit: Payer: Self-pay

## 2024-01-13 DIAGNOSIS — R1030 Lower abdominal pain, unspecified: Secondary | ICD-10-CM

## 2024-01-13 DIAGNOSIS — K6389 Other specified diseases of intestine: Secondary | ICD-10-CM | POA: Diagnosis not present

## 2024-01-13 DIAGNOSIS — R110 Nausea: Secondary | ICD-10-CM | POA: Diagnosis not present

## 2024-01-13 DIAGNOSIS — R1909 Other intra-abdominal and pelvic swelling, mass and lump: Secondary | ICD-10-CM

## 2024-01-13 DIAGNOSIS — K760 Fatty (change of) liver, not elsewhere classified: Secondary | ICD-10-CM | POA: Diagnosis not present

## 2024-01-13 DIAGNOSIS — R160 Hepatomegaly, not elsewhere classified: Secondary | ICD-10-CM | POA: Diagnosis not present

## 2024-01-13 DIAGNOSIS — Z87891 Personal history of nicotine dependence: Secondary | ICD-10-CM | POA: Diagnosis not present

## 2024-01-13 DIAGNOSIS — E860 Dehydration: Secondary | ICD-10-CM | POA: Diagnosis not present

## 2024-01-13 DIAGNOSIS — K5732 Diverticulitis of large intestine without perforation or abscess without bleeding: Secondary | ICD-10-CM | POA: Diagnosis not present

## 2024-01-13 DIAGNOSIS — Z882 Allergy status to sulfonamides status: Secondary | ICD-10-CM | POA: Diagnosis not present

## 2024-01-13 DIAGNOSIS — K5900 Constipation, unspecified: Secondary | ICD-10-CM | POA: Diagnosis not present

## 2024-01-13 NOTE — ED Provider Notes (Signed)
 [facility]    CSN: [id number] Arrival date & time: 01/13/24  1106      History   Chief Complaint Chief Complaint  Patient presents with   Groin Pain   Constipation    HPI Cainan A Hylton is a 33 y.o. male.   Patient presents with concerns of constipation and right groin swelling that he noticed about 4 days ago.  Reports that he has lower abdominal pain that is severe in nature and intermittent.  He describes it as a cramping sensation.  He has not had a bowel movement since pain started.  He typically has daily bowel movements.  He is urinating normally with no hematuria.  Denies fever, body aches, chills.  He has taken glycerin suppositories, fleets enema, MiraLAX with no production of bowel movement.  He also noticed an area of swelling in his right groin around the same time of other symptoms.  He is passing flatulence.   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 Authorizing Provider  hydrocortisone  cream 0.5 % Apply to affected area 1-2 times daily 04/30/23   Job Lukes, PA  ketoconazole  (NIZORAL ) 2 % cream Apply to affected area one time daily 06/08/23   Job Lukes, PA  valACYclovir  (VALTREX ) 500 MG tablet Take 1 tablet (500 mg total) by mouth daily. Take 500 mg daily 09/27/23   Job Lukes, PA    Family History Family History  Problem Relation Age of Onset   Depression Mother    ADD / ADHD Mother    ADD / ADHD Sister    Prostate cancer Maternal Grandfather        mets to  bone   Diabetes Maternal Grandfather    Diabetes Paternal Grandmother    Lung cancer Paternal Grandmother    Stroke Paternal Grandfather    Asthma Son    Colon cancer Neg Hx     Social History Social History   Tobacco Use   Smoking status: Every Day    Current packs/day: 0.25    Average packs/day: 0.3 packs/day for 2.3 years (0.6 ttl pk-yrs)    Types: Cigarettes   Smokeless tobacco: Never   Tobacco comments:    Using Nicotine pouches 3 mg twice a day.  Vaping Use   Vaping status: Former  Substance Use Topics   Alcohol use: Yes    Alcohol/week: 3.0 standard drinks of alcohol    Types: 3 Cans of beer per week    Comment: social drinker   Drug use: No     Allergies   Sulfa antibiotics   Review of Systems Review of Systems Per HPI  Physical Exam Triage Vital Signs ED Triage Vitals  Encounter Vitals Group     BP 01/13/24 1114 119/65     Girls Systolic BP Percentile --      Girls Diastolic BP Percentile --      Boys Systolic BP Percentile --      Boys Diastolic BP  Percentile --      Pulse Rate 01/13/24 1114 (!) 104     Resp 01/13/24 1114 16     Temp 01/13/24 1114 97.8 F (36.6 C)     Temp src --      SpO2 01/13/24 1114 100 %     Weight --      Height --      Head Circumference --      Peak Flow --      Pain Score 01/13/24 1117 6     Pain Loc --      Pain Education --      Exclude from Growth Chart --    No data found.  Updated Vital Signs BP 119/65   Pulse (!) 104   Temp 97.8 F (36.6 C)   Resp 16   SpO2 100%   Visual Acuity Right Eye Distance:   Left Eye Distance:   Bilateral Distance:    Right Eye Near:   Left Eye Near:    Bilateral Near:     Physical Exam Exam conducted with a chaperone present.  Constitutional:      General: He is not in acute distress.    Appearance: Normal appearance. He is not toxic-appearing or diaphoretic.  HENT:     Head: Normocephalic and atraumatic.  Eyes:     Extraocular Movements: Extraocular movements  intact.     Conjunctiva/sclera: Conjunctivae normal.  Pulmonary:     Effort: Pulmonary effort is normal.  Abdominal:     General: Bowel sounds are normal. There is no distension.     Palpations: Abdomen is soft.     Tenderness: There is abdominal tenderness.     Comments: Patient is significantly tender to palpation across lower abdomen.  Patient has area of swelling present to right mid groin.  No discoloration noted.  Neurological:     General: No focal deficit present.     Mental Status: He is alert and oriented to person, place, and time. Mental status is at baseline.  Psychiatric:        Mood and Affect: Mood normal.        Behavior: Behavior normal.        Thought Content: Thought content normal.        Judgment: Judgment normal.      UC Treatments / Results  Labs (all labs ordered are listed, but only abnormal results are displayed) Labs Reviewed - No data to display  EKG   Radiology No results found.  Procedures Procedures (including critical care time)  Medications Ordered in UC Medications - No data to display  Initial Impression / Assessment and Plan / UC Course  I have reviewed the triage vital signs and the nursing notes.  Pertinent labs & imaging results that were available during my care of the patient were reviewed by me and considered in my medical decision making (see chart for details).     Differential diagnoses include appendicitis versus constipation versus small bowel obstruction versus inguinal hernia versus inguinal lymph node swelling.   Patient is significantly tender to palpation to abdomen on exam so I do think that CT imaging and ER evaluation is necessary.  Advised patient that he would need to go to the ER for further evaluation and management.  He was agreeable with plan.  Patient wished to self transport. Final Clinical Impressions(s) / UC Diagnoses   Final diagnoses:  Lower abdominal pain  Inguinal swelling   Discharge  Instructions  None    ED Prescriptions   None    PDMP not reviewed this encounter.   Hazen Darryle BRAVO, [place] 01/13/24 [phone]

## 2024-01-13 NOTE — ED Notes (Signed)
 Patient is being discharged from the Urgent Care and sent to the Emergency Department via pov . Per mound np, patient is in need of higher level of care due to groin pain. Patient is aware and verbalizes understanding of plan of care.  Vitals:   01/13/24 1114  BP: 119/65  Pulse: (!) 104  Resp: 16  Temp: 97.8 F (36.6 C)  SpO2: 100%

## 2024-01-13 NOTE — ED Triage Notes (Signed)
 Has not had bm since Thursday. Reports has been having sharp pain above right groin. Area feels tight and reports he has a lump above groin that is soft and does not hurt when he touches it. Has had fleet suppository and miralax.

## 2024-01-14 ENCOUNTER — Encounter: Payer: Self-pay | Admitting: Physician Assistant

## 2024-01-16 ENCOUNTER — Ambulatory Visit: Admitting: Physician Assistant

## 2024-01-16 ENCOUNTER — Encounter: Payer: Self-pay | Admitting: Physician Assistant

## 2024-01-16 VITALS — BP 136/80 | HR 93 | Temp 98.0°F | Ht 70.0 in | Wt 188.0 lb

## 2024-01-16 DIAGNOSIS — K760 Fatty (change of) liver, not elsewhere classified: Secondary | ICD-10-CM

## 2024-01-16 DIAGNOSIS — K4020 Bilateral inguinal hernia, without obstruction or gangrene, not specified as recurrent: Secondary | ICD-10-CM

## 2024-01-16 DIAGNOSIS — K5900 Constipation, unspecified: Secondary | ICD-10-CM | POA: Diagnosis not present

## 2024-01-16 DIAGNOSIS — Z8719 Personal history of other diseases of the digestive system: Secondary | ICD-10-CM | POA: Diagnosis not present

## 2024-01-16 NOTE — Progress Notes (Signed)
 History of Present Illness:   Chief Complaint  Patient presents with   Hospitalization Follow-up    Discussed the use of AI scribe software for clinical note transcription with the patient, who gave verbal consent to proceed.  History of Present Illness   Nathan Johnson is a 33 year old male who presents with diverticulitis and constipation.  He had 4 days of constipation, right lower quadrant pain, and a new right groin lump before going to urgent care on 11/30 -- was referred to ER. CT showed diverticulitis without reported abscess or perforation. WBC was 15. He started a 7-day antibiotic course -- flagyl and cipro and now feels much better, with resolved pain and normal bowel movements, including four since yesterday, without blood or diarrhea. Urine is clear.  CT also showed small fat-containing bilateral inguinal hernias. He notices a soft, painless lump only on the right, which he first saw on Sunday after moderate weight lifting at the gym.  He has fatty liver with mild hepatomegaly but recent liver and kidney function were normal. WBC was 15 at the time of diverticulitis. He drinks water regularly and has cut alcohol to two beers in the past month.  He quit smoking the Monday before Halloween and has reduced alcohol. He uses Miralax daily for constipation, which has improved his bowel movements. He is cautious with his diet to avoid worsening constipation.       Past Medical History:  Diagnosis Date   Allergy    Depression      Social History   Tobacco Use   Smoking status: Every Day    Current packs/day: 0.25    Average packs/day: 0.3 packs/day for 2.3 years (0.6 ttl pk-yrs)    Types: Cigarettes   Smokeless tobacco: Never   Tobacco comments:    Using Nicotine pouches 3 mg twice a day.  Vaping Use   Vaping status: Former  Substance Use Topics   Alcohol use: Yes    Alcohol/week: 3.0 standard drinks of alcohol    Types: 3 Cans of beer per week    Comment: social  drinker   Drug use: No    Past Surgical History:  Procedure Laterality Date   WISDOM TOOTH EXTRACTION      Family History  Problem Relation Age of Onset   Depression Mother    ADD / ADHD Mother    ADD / ADHD Sister    Prostate cancer Maternal Grandfather        mets to bone   Diabetes Maternal Grandfather    Diabetes Paternal Grandmother    Lung cancer Paternal Grandmother    Stroke Paternal Grandfather    Asthma Son    Colon cancer Neg Hx     Allergies  Allergen Reactions   Sulfa Antibiotics Rash    Current Medications:   Current Outpatient Medications:    acetaminophen (TYLENOL) 325 MG tablet, Take 650 mg by mouth every 4 (four) hours as needed for mild pain (pain score 1-3)., Disp: , Rfl:    ciprofloxacin (CIPRO) 500 MG tablet, Take 500 mg by mouth 2 (two) times daily., Disp: , Rfl:    hydrocortisone  cream 0.5 %, Apply to affected area 1-2 times daily, Disp: 30 g, Rfl: 0   ketoconazole  (NIZORAL ) 2 % cream, Apply to affected area one time daily, Disp: 15 g, Rfl: 0   metroNIDAZOLE (FLAGYL) 500 MG tablet, Take 500 mg by mouth 2 (two) times daily., Disp: , Rfl:    ondansetron (ZOFRAN-ODT)  4 MG disintegrating tablet, Take 4 mg by mouth every 8 (eight) hours as needed., Disp: , Rfl:    polyethylene glycol (MIRALAX / GLYCOLAX) 17 g packet, Take 17 g by mouth daily., Disp: , Rfl:    valACYclovir  (VALTREX ) 500 MG tablet, Take 1 tablet (500 mg total) by mouth daily. Take 500 mg daily, Disp: 90 tablet, Rfl: 3   Review of Systems:   Negative unless otherwise specified per HPI.  Vitals:   Vitals:   01/16/24 1038  BP: 136/80  Pulse: 93  Temp: 98 F (36.7 C)  TempSrc: Temporal  SpO2: 98%  Weight: 188 lb (85.3 kg)  Height: 5' 10 (1.778 m)     Body mass index is 26.98 kg/m.  Physical Exam:   Physical Exam Vitals and nursing note reviewed.  Constitutional:      Appearance: He is well-developed.  HENT:     Head: Normocephalic.  Eyes:     Conjunctiva/sclera:  Conjunctivae normal.     Pupils: Pupils are equal, round, and reactive to light.  Pulmonary:     Effort: Pulmonary effort is normal.  Abdominal:     General: Abdomen is flat. Bowel sounds are normal.     Palpations: Abdomen is soft.     Tenderness: There is abdominal tenderness in the right lower quadrant. There is no guarding or rebound.  Musculoskeletal:        General: Normal range of motion.     Cervical back: Normal range of motion.  Skin:    General: Skin is warm and dry.  Neurological:     Mental Status: He is alert and oriented to person, place, and time.  Psychiatric:        Behavior: Behavior normal.        Thought Content: Thought content normal.        Judgment: Judgment normal.     Assessment and Plan:   Assessment and Plan    History of acute diverticulitis of sigmoid colon Acute diverticulitis confirmed by CT scan with significant wall thickening and inflammation. No abscess or perforation. High WBC count consistent with infection. Symptoms resolved with antibiotics. Colonoscopy deferred until infection resolves. - Continue antibiotics as prescribed. - Referred to gastroenterology for further evaluation and management. - Advised low fiber diet and bowel rest during acute phase. - Educated on signs of recurrence and importance of regular bowel movements.  Constipation Chronic constipation contributing to diverticulitis. Managed with Miralax, resulting in improved bowel movements. - Continue Miralax daily, adjust dose if stools become too loose. - Educated on importance of regular bowel movements to prevent recurrence of diverticulitis.  Bilateral inguinal hernias Incidental finding of small fat-containing bilateral inguinal hernias on CT scan. No symptoms or pain. Risk of progression if not monitored. - Monitor for symptoms such as increased lump size or pain. - Advised against heavy weight lifting to prevent worsening of hernias. - Referred to surgery if  symptoms develop.  Fatty liver with mild hepatomegaly Fatty liver with mild hepatomegaly noted on CT scan. No significant liver function abnormalities. Lifestyle modifications recommended. - Referred to gastroenterology for further evaluation. - Advised on healthy diet and lifestyle modifications to manage fatty liver.  Tobacco use, in remission Tobacco use in remission since before Halloween. Positive lifestyle changes noted. - Continue to abstain from smoking.   Lucie Buttner, PA-C

## 2024-01-28 ENCOUNTER — Encounter: Payer: Self-pay | Admitting: Internal Medicine

## 2024-01-28 ENCOUNTER — Ambulatory Visit: Admitting: Internal Medicine

## 2024-01-28 VITALS — BP 104/72 | HR 78 | Ht 70.0 in | Wt 182.0 lb

## 2024-01-28 DIAGNOSIS — K760 Fatty (change of) liver, not elsewhere classified: Secondary | ICD-10-CM

## 2024-01-28 DIAGNOSIS — K5732 Diverticulitis of large intestine without perforation or abscess without bleeding: Secondary | ICD-10-CM

## 2024-01-28 MED ORDER — NA SULFATE-K SULFATE-MG SULF 17.5-3.13-1.6 GM/177ML PO SOLN: 1.0000 | ORAL | 0 refills | Status: DC

## 2024-01-28 NOTE — Progress Notes (Signed)
 Nathan Johnson 33 y.o. 1990/12/11 [id number]  Assessment & Plan:   Encounter Diagnoses  Name Primary?   Diverticulitis of colon - resolved Yes   Hepatic steatosis      Diverticulitis of the colon Recent diverticulitis episode resolved with antibiotics. Discussed high-fiber diet may help prevent recurrence. Colonoscopy advised to rule out polyps or cancer (not likely). - Schedule colonoscopy within months. - Maintain high-fiber diet.  Hepatic steatosis Increased liver fat noted. No significant alcohol use now but sounds like was heavier in recent past and this may account for steatosis.  - Continue lifestyle modifications, reduce alcohol, maintain weight loss.     Fibrosis 4 Score = .73 (Low risk)        Interpretation for patients with NAFLD          <1.30       -  F0-F1 (Low risk)          1.30-2.67 -  Indeterminate           >2.67      -  F3-F4 (High risk)     Validated for ages 43-65      I appreciate the opportunity to care for this patient. CC: Job Lukes, PA    Subjective:   Chief Complaint: Diverticulitis  HPI Discussed the use of AI scribe software for clinical note transcription with the patient, who gave verbal consent to proceed.  History of Present Illness   Nathan Johnson is a 33 year old male who presents with a recent diagnosis of diverticulitis. Seen in the ED as listed below.  He asked to see me because I take care of his grandmother, Bobbye Reinitz.  She has a history of Crohn's disease.  Abdominal pain and bowel habit changes - Left-sided abdominal pain began on Thanksgiving evening and progressively worsened - Associated with constipation at onset - No current abdominal pain or discomfort - Bowel habits have returned to baseline, with regular early morning bowel movements - Diarrhea occurred on the third or fourth day of antibiotic therapy - No nausea during illness - Appetite remained good throughout  Recent diverticulitis  diagnosis and management - Diagnosed with diverticulitis on November 30th via emergency department evaluation - Treated with antibiotics and Miralax - Symptoms improved with treatment - Resumed normal diet, including high-fiber foods such as broccoli - Avoided seeds and nuts during acute phase, now tolerating them without issue  Hernias and hepatic steatosis - CT scan during ER visit revealed fat-containing hernias and increased hepatic fat - No discomfort or symptoms from hernias - Lost 40 to 42 pounds over the past few years, previously insulin resistant  Alcohol use - History of increased alcohol consumption following divorce - Currently limits alcohol intake to social gatherings - Girlfriend also limits alcohol intake       ED visit [place] 01/13/24  WBC 15 Hgb 14.9  CMET NL  CT abd/pelvis w/ contrast IMPRESSION:  1. Significant wall thickening and inflammation involving the sigmoid colon in the region of diverticulosis primarily concerning for acute diverticulitis. Colitis is secondary consideration. No abscess or free air.   2. Mild hepatomegaly with fatty infiltration.   3. Additional findings as detailed above. 2 fat-containing inguinal hernias   Allergies[1] Active Medications[1] Past Medical History:  Diagnosis Date   Allergy    Bipolar disorder (HCC)    Depression    Diverticulitis    Hepatic steatosis    HSV infection    genital and oral   Inguinal  hernia    Bilateral   Insulin resistance    Tobacco abuse    Past Surgical History:  Procedure Laterality Date   WISDOM TOOTH EXTRACTION     Social History   Social History Narrative   Divorced   77 year old son 50/50 - ex is local   Works in [employer] in the [employer]   family history includes ADD / ADHD in his mother and sister; Asthma in his son; Bipolar disorder in his mother; Depression in his mother; Diabetes in his maternal grandfather and paternal grandmother; Lung cancer in his  paternal grandmother; Prostate cancer in his maternal grandfather; Stroke in his paternal grandfather.   Review of Systems As per HPI otherwise negative  Objective:   Physical Exam @BP  104/72   Pulse 78   Ht 5' 10 (1.778 m)   Wt 182 lb (82.6 kg)   SpO2 97%   BMI 26.11 kg/m @  General:  NAD Eyes:   anicteric Lungs:  clear Heart::  S1S2 no rubs, murmurs or gallops Abdomen:  soft and nontender, BS+ Ext:   no edema, cyanosis or clubbing    Data Reviewed:  See HPI     [1]  Allergies Allergen Reactions   Sulfa Antibiotics Rash  [1]  Current Meds  Medication Sig   acetaminophen (TYLENOL) 325 MG tablet Take 650 mg by mouth every 4 (four) hours as needed for mild pain (pain score 1-3).   hydrocortisone  cream 0.5 % Apply to affected area 1-2 times daily   ketoconazole  (NIZORAL ) 2 % cream Apply to affected area one time daily   ondansetron (ZOFRAN-ODT) 4 MG disintegrating tablet Take 4 mg by mouth every 8 (eight) hours as needed.   polyethylene glycol (MIRALAX / GLYCOLAX) 17 g packet Take 17 g by mouth daily.   valACYclovir  (VALTREX ) 500 MG tablet Take 1 tablet (500 mg total) by mouth daily. Take 500 mg daily

## 2024-01-28 NOTE — Patient Instructions (Signed)
 You have been scheduled for a colonoscopy. Please follow written instructions given to you at your visit today.   If you use inhalers (even only as needed), please bring them with you on the day of your procedure.  DO NOT TAKE 7 DAYS PRIOR TO TEST- Trulicity (dulaglutide) Ozempic, Wegovy (semaglutide) Mounjaro, Zepbound (tirzepatide) Bydureon Bcise (exanatide extended release)  DO NOT TAKE 1 DAY PRIOR TO YOUR TEST Rybelsus (semaglutide) Adlyxin (lixisenatide) Victoza (liraglutide) Byetta (exanatide) ___________________________________________________________________________  _______________________________________________________  If your blood pressure at your visit was 140/90 or greater, please contact your primary care physician to follow up on this.  _______________________________________________________  If you are age 44 or older, your body mass index should be between 23-30. Your Body mass index is 26.11 kg/m. If this is out of the aforementioned range listed, please consider follow up with your Primary Care Provider.  If you are age 10 or younger, your body mass index should be between 19-25. Your Body mass index is 26.11 kg/m. If this is out of the aformentioned range listed, please consider follow up with your Primary Care Provider.   ________________________________________________________  The [facility] providers would like to encourage you to use MYCHART to communicate with providers for non-urgent requests or questions.  Due to long hold times on the telephone, sending your provider a message by [facility] may be a faster and more efficient way to get a response.  Please allow 48 business hours for a response.  Please remember that this is for non-urgent requests.  _______________________________________________________  [facility] is using a team-based approach to care.  Your team is made up of your doctor and two to three APPS. Our APPS (Nurse  Practitioners and Physician Assistants) work with your physician to ensure care continuity for you. They are fully qualified to address your health concerns and develop a treatment plan. They communicate directly with your gastroenterologist to care for you. Seeing the Advanced Practice Practitioners on your physician's team can help you by facilitating care more promptly, often allowing for earlier appointments, access to diagnostic testing, procedures, and other specialty referrals.   I appreciate the opportunity to care for you. Lupita Commander, MD, [facility]

## 2024-02-15 ENCOUNTER — Encounter: Payer: Self-pay | Admitting: Internal Medicine

## 2024-02-21 ENCOUNTER — Ambulatory Visit (AMBULATORY_SURGERY_CENTER): Admitting: Internal Medicine

## 2024-02-21 ENCOUNTER — Encounter: Payer: Self-pay | Admitting: Internal Medicine

## 2024-02-21 VITALS — BP 112/70 | HR 68 | Temp 98.2°F | Resp 16 | Ht 70.0 in | Wt 182.0 lb

## 2024-02-21 DIAGNOSIS — K5732 Diverticulitis of large intestine without perforation or abscess without bleeding: Secondary | ICD-10-CM

## 2024-02-21 DIAGNOSIS — K5730 Diverticulosis of large intestine without perforation or abscess without bleeding: Secondary | ICD-10-CM | POA: Diagnosis present

## 2024-02-21 MED ORDER — SODIUM CHLORIDE 0.9 % IV SOLN: 500.0000 mL | Freq: Once | INTRAVENOUS | Status: DC

## 2024-02-21 NOTE — Progress Notes (Signed)
 A/o x 3, VSS, good SR's, pleased with anesthesia, report to RN

## 2024-02-21 NOTE — Op Note (Signed)
 [facility] Patient Name: Nathan Johnson Procedure Date: 02/21/2024 3:25 PM MRN: [id number] Endoscopist: Lupita FORBES Commander , MD, [id number] Age: 34 Referring MD:  Date of Birth: Aug 11, 1990 Gender: Male Account #: [id number] Procedure:                Colonoscopy Indications:              Follow-up of diverticulitis Medicines:                Monitored Anesthesia Care Procedure:                Pre-Anesthesia Assessment:                           - Prior to the procedure, a History and Physical                            was performed, and patient medications and                            allergies were reviewed. The patient's tolerance of                            previous anesthesia was also reviewed. The risks                            and benefits of the procedure and the sedation                            options and risks were discussed with the patient.                            All questions were answered, and informed consent                            was obtained. Prior Anticoagulants: The patient has                            taken no anticoagulant or antiplatelet agents. ASA                            Grade Assessment: II - A patient with mild systemic                            disease. After reviewing the risks and benefits,                            the patient was deemed in satisfactory condition to                            undergo the procedure.                           After obtaining informed consent, the colonoscope  was passed under direct vision. Throughout the                            procedure, the patient's blood pressure, pulse, and                            oxygen saturations were monitored continuously. The                            Olympus Scope SN [id number] was introduced through the                            anus and advanced to the the cecum, identified by                            appendiceal orifice and  ileocecal valve. The                            colonoscopy was performed without difficulty. The                            patient tolerated the procedure well. The quality                            of the bowel preparation was good. The ileocecal                            valve, appendiceal orifice, and rectum were                            photographed. The bowel preparation used was SUPREP                            via split dose instruction. Scope In: 3:39:05 PM Scope Out: 3:53:16 PM Scope Withdrawal Time: 0 hours 8 minutes 27 seconds  Total Procedure Duration: 0 hours 14 minutes 11 seconds  Findings:                 The perianal and digital rectal examinations were                            normal. Pertinent negatives include normal prostate                            (size, shape, and consistency).                           Multiple medium-mouthed and small-mouthed                            diverticula were found in the sigmoid colon and                            descending colon. There was narrowing of the colon  in association with the diverticular opening.                           The exam was otherwise without abnormality on                            direct and retroflexion views. Complications:            No immediate complications. Estimated Blood Loss:     Estimated blood loss: none. Impression:               - Severe diverticulosis in the sigmoid colon and in                            the descending colon. There was narrowing of the                            colon in association with the diverticular opening.                           - The examination was otherwise normal on direct                            and retroflexion views.                           - No specimens collected. Recommendation:           - Patient has a contact number available for                            emergencies. The signs and symptoms of potential                             delayed complications were discussed with the                            patient. Return to normal activities tomorrow.                            Written discharge instructions were provided to the                            patient.                           - High fiber diet.                           - Continue present medications.                           - Repeat colonoscopy in 10 years for screening                            purposes. Lupita FORBES Commander, MD 02/21/2024 3:59:31 PM This report has been signed electronically.

## 2024-02-21 NOTE — Progress Notes (Signed)
 History and Physical Interval Note:  02/21/2024 3:24 PM  Nathan Johnson  has presented today for endoscopic procedure(s), with the diagnosis of  Encounter Diagnosis  Name Primary?   Diverticulitis of colon Yes  .  The various methods of evaluation and treatment have been discussed with the patient and/or family. After consideration of risks, benefits and other options for treatment, the patient has consented to  the endoscopic procedure(s).   The patient's history has been reviewed, patient examined, no change in status, stable for endoscopic procedure(s).  I have reviewed the patient's chart and labs.  Questions were answered to the patient's satisfaction.     [name], MD, [facility]

## 2024-02-21 NOTE — Patient Instructions (Addendum)
 I saw your diverticulosis, no diverticulitis now.  No polyps or cancer, either!  You should repeat a colonoscopy in about 10 years at age 34.  I hope you do not get diverticulitis again.  There is no specific regimen known to really prevent that.  We do think a high-fiber diet is helpful.  If you get signs or symptoms of diverticulitis again you should promptly contact me or Ms. Worley.  I appreciate the opportunity to care for you. [name], MD, FACG  YOU HAD AN ENDOSCOPIC PROCEDURE TODAY AT THE [facility]:   Refer to the procedure report that was given to you for any specific questions about what was found during the examination.  If the procedure report does not answer your questions, please call your gastroenterologist to clarify.  If you requested that your care partner not be given the details of your procedure findings, then the procedure report has been included in a sealed envelope for you to review at your convenience later.  YOU SHOULD EXPECT: Some feelings of bloating in the abdomen. Passage of more gas than usual.  Walking can help get rid of the air that was put into your GI tract during the procedure and reduce the bloating. If you had a lower endoscopy (such as a colonoscopy or flexible sigmoidoscopy) you may notice spotting of blood in your stool or on the toilet paper. If you underwent a bowel prep for your procedure, you may not have a normal bowel movement for a few days.  Please Note:  You might notice some irritation and congestion in your nose or some drainage.  This is from the oxygen used during your procedure.  There is no need for concern and it should clear up in a day or so.  SYMPTOMS TO REPORT IMMEDIATELY:  Following lower endoscopy (colonoscopy or flexible sigmoidoscopy):  Excessive amounts of blood in the stool  Significant tenderness or worsening of abdominal pains  Swelling of the abdomen that is new, acute  Fever of 100F or higher   For  urgent or emergent issues, a gastroenterologist can be reached at any hour by calling [phone]. Do not use MyChart messaging for urgent concerns.    DIET:  We do recommend a small meal at first, but then you may proceed to your regular diet.  Drink plenty of fluids but you should avoid alcoholic beverages for 24 hours.  ACTIVITY:  You should plan to take it easy for the rest of today and you should NOT DRIVE or use heavy machinery until tomorrow (because of the sedation medicines used during the test).    FOLLOW UP: Our staff will call the number listed on your records the next business day following your procedure.  We will call around 7:15- 8:00 am to check on you and address any questions or concerns that you may have regarding the information given to you following your procedure. If we do not reach you, we will leave a message.     If any biopsies were taken you will be contacted by phone or by letter within the next 1-3 weeks.  Please call us  at [phone] if you have not heard about the biopsies in 3 weeks.    SIGNATURES/CONFIDENTIALITY: You and/or your care partner have signed paperwork which will be entered into your electronic medical record.  These signatures attest to the fact that that the information above on your After Visit Summary has been reviewed and is understood.  Full  responsibility of the confidentiality of this discharge information lies with you and/or your care-partner.

## 2024-02-21 NOTE — Progress Notes (Signed)
 Pt's states no medical or surgical changes since previsit or office visit.

## 2024-02-22 ENCOUNTER — Telehealth: Payer: Self-pay | Admitting: *Deleted

## 2024-02-22 NOTE — Telephone Encounter (Signed)
" °  Follow up Call-     02/21/2024    2:31 PM  Call back number  Post procedure Call Back phone  # [phone]  Permission to leave phone message Yes     Patient questions:  Do you have a fever, pain , or abdominal swelling? No. Pain Score  0 *  Have you tolerated food without any problems? Yes.    Have you been able to return to your normal activities? Yes.    Do you have any questions about your discharge instructions: Diet   No. Medications  No. Follow up visit  No.  Do you have questions or concerns about your Care? No.  Actions: * If pain score is 4 or above: No action needed, pain <4.   "

## 2024-10-02 ENCOUNTER — Encounter: Admitting: Physician Assistant

## 2024-10-09 ENCOUNTER — Encounter: Admitting: Physician Assistant
